# Patient Record
Sex: Female | Born: 1980 | Race: Black or African American | Hispanic: No | Marital: Married | Smoking: Never smoker
Health system: Southern US, Community
[De-identification: ages and names within clinical notes are randomized; demographics above are authoritative.]

## PROBLEM LIST (undated history)

## (undated) ENCOUNTER — Inpatient Hospital Stay: Payer: Self-pay

## (undated) DIAGNOSIS — E079 Disorder of thyroid, unspecified: Secondary | ICD-10-CM

## (undated) HISTORY — PX: THYROIDECTOMY, PARTIAL: SHX18

---

## 2015-10-03 ENCOUNTER — Emergency Department
Admission: EM | Admit: 2015-10-03 | Discharge: 2015-10-03 | Disposition: A | Discharge status: Home / Self Care | Payer: BLUE CROSS/BLUE SHIELD | Attending: Emergency Medicine | Admitting: Emergency Medicine

## 2015-10-03 ENCOUNTER — Emergency Department: Payer: BLUE CROSS/BLUE SHIELD

## 2015-10-03 DIAGNOSIS — R079 Chest pain, unspecified: Secondary | ICD-10-CM | POA: Diagnosis present

## 2015-10-03 LAB — COMPREHENSIVE METABOLIC PANEL
ALT: 15 U/L (ref 14–54)
ANION GAP: 5 (ref 5–15)
AST: 18 U/L (ref 15–41)
Albumin: 4.3 g/dL (ref 3.5–5.0)
Alkaline Phosphatase: 56 U/L (ref 38–126)
BUN: 12 mg/dL (ref 6–20)
CHLORIDE: 107 mmol/L (ref 101–111)
CO2: 28 mmol/L (ref 22–32)
Calcium: 9.4 mg/dL (ref 8.9–10.3)
Creatinine, Ser: 0.76 mg/dL (ref 0.44–1.00)
Glucose, Bld: 107 mg/dL — ABNORMAL HIGH (ref 65–99)
POTASSIUM: 4 mmol/L (ref 3.5–5.1)
Sodium: 140 mmol/L (ref 135–145)
Total Bilirubin: 0.6 mg/dL (ref 0.3–1.2)
Total Protein: 7.4 g/dL (ref 6.5–8.1)

## 2015-10-03 LAB — TROPONIN I

## 2015-10-03 LAB — CBC
HCT: 40.4 % (ref 35.0–47.0)
Hemoglobin: 13.2 g/dL (ref 12.0–16.0)
MCH: 29.3 pg (ref 26.0–34.0)
MCHC: 32.7 g/dL (ref 32.0–36.0)
MCV: 89.5 fL (ref 80.0–100.0)
PLATELETS: 237 10*3/uL (ref 150–440)
RBC: 4.52 MIL/uL (ref 3.80–5.20)
RDW: 13.1 % (ref 11.5–14.5)
WBC: 7.2 10*3/uL (ref 3.6–11.0)

## 2015-10-03 LAB — FIBRIN DERIVATIVES D-DIMER (ARMC ONLY): FIBRIN DERIVATIVES D-DIMER (ARMC): 467 (ref 0–499)

## 2015-10-03 MED ORDER — IBUPROFEN 600 MG PO TABS: 600.0000 mg | ORAL_TABLET | Freq: Four times a day (QID) | ORAL | Status: DC | PRN

## 2015-10-03 MED ORDER — TRAMADOL HCL 50 MG PO TABS: 50.0000 mg | ORAL_TABLET | Freq: Four times a day (QID) | ORAL | Status: AC | PRN

## 2015-10-03 NOTE — Discharge Instructions (Signed)

## 2015-10-03 NOTE — ED Notes (Signed)
Patient with right side chest pain that started yesterday and is worse with cough and deep breathing.

## 2015-10-03 NOTE — ED Provider Notes (Signed)
[facility] Emergency Department Provider Note  Time seen: 3:26 PM  I have reviewed the triage vital signs and the nursing notes.   HISTORY  Chief Complaint Chest Pain    HPI Rebecca Walters is a 34 y.o. female with no past medical history who presents the emergency department with mid and right-sided chest pain since yesterday. According to the patient she developed mid to right-sided chest pain yesterday. Does not recall doing anything to cause the pain. States the pain is much worse with any deep breath or cough. Denies any recent cough however. Denies any fever, shortness of breath, leg pain or swelling. Denies any history of DVT or cardiac disease. Denies any family history of DVT. Denies fever. Denies hemoptysis. Describes her central to right-sided chest pain as mild at baseline, moderate sharp pain with deep inspiration.    History reviewed. No pertinent past medical history.  There are no active problems to display for this patient.   History reviewed. No pertinent past surgical history.  No current outpatient prescriptions on file.  Allergies Review of patient's allergies indicates no known allergies.  History reviewed. No pertinent family history.  Social History Social History  Substance Use Topics  . Smoking status: Never Smoker   . Smokeless tobacco: None  . Alcohol Use: Yes    Review of Systems Constitutional: Negative for fever. Cardiovascular: Positive for chest pain worse with inspiration Respiratory: Negative for shortness of breath. Gastrointestinal: Negative for abdominal pain, vomiting and diarrhea. Negative for nausea. Musculoskeletal: Negative for back pain Neurological: Negative for headache 10-point ROS otherwise negative.  ____________________________________________   PHYSICAL EXAM:  VITAL SIGNS: ED Triage Vitals  Enc Vitals Group     BP 10/03/15 1358 130/78 mmHg     Pulse Rate 10/03/15 1358 63     Resp  10/03/15 1358 17     Temp 10/03/15 1358 98.6 F (37 C)     Temp Source 10/03/15 1358 Oral     SpO2 10/03/15 1358 99 %     Weight 10/03/15 1358 200 lb (90.719 kg)     Height 10/03/15 1358  (1.676 m)     Head Cir --      Peak Flow --      Pain Score 10/03/15 1325 10     Pain Loc --      Pain Edu? --      Excl. in GC? --    Constitutional: Alert and oriented. Well appearing and in no distress. Eyes: Normal exam ENT   Head: Normocephalic and atraumatic   Mouth/Throat: Mucous membranes are moist. Cardiovascular: Normal rate, regular rhythm. No murmur Respiratory: Normal respiratory effort without tachypnea nor retractions. Breath sounds are clear and equal bilaterally. No wheezes/rales/rhonchi. No chest pain to palpation. Gastrointestinal: Soft and nontender. No distention.   Musculoskeletal: Nontender with normal range of motion in all extremities. No lower extremity tenderness or edema. Neurologic:  Normal speech and language. No gross focal neurologic deficits Skin:  Skin is warm, dry and intact.  Psychiatric: Mood and affect are normal. Speech and behavior are normal.  ____________________________________________    EKG  EKG reviewed and interpreted by myself shows normal sinus rhythm at 62 bpm, narrow QS, normal axis, normal intervals, no ST changes present. Overall normal EKG.  ____________________________________________    RADIOLOGY  Chest x-ray shows no acute abnormality.  ____________________________________________    INITIAL IMPRESSION / ASSESSMENT AND PLAN / ED COURSE  Pertinent labs & imaging results that were available  during my care of the patient were reviewed by me and considered in my medical decision making (see chart for details).  Patient presents with pleuritic chest pain since yesterday. Her labs are reassuring, negative troponin. Negative chest x-ray, normal EKG. 63 pulse rate, 99% oxygen saturation. However given the pleuritic nature  of the patient's chest pain, will proceed with a d-dimer to help further evaluate. I discussed this with the patient who is agreeable.  D-dimer less than 500. We'll discharge home with primary care follow-up. We'll prescribe a short course of Ultram as well as anti-inflammatories. Patient agreeable to plan. Discussed strict chest pain return precautions with the patient which she is agreeable.  ____________________________________________   FINAL CLINICAL IMPRESSION(S) / ED DIAGNOSES  Chest pain   Minna AntisKevin Aisea Bouldin, MD 10/03/15 1722

## 2016-03-11 ENCOUNTER — Emergency Department
Admission: EM | Admit: 2016-03-11 | Discharge: 2016-03-11 | Disposition: A | Discharge status: Home / Self Care | Payer: BLUE CROSS/BLUE SHIELD | Attending: Student | Admitting: Student

## 2016-03-11 ENCOUNTER — Emergency Department: Payer: BLUE CROSS/BLUE SHIELD

## 2016-03-11 ENCOUNTER — Encounter: Payer: Self-pay | Admitting: Emergency Medicine

## 2016-03-11 DIAGNOSIS — R52 Pain, unspecified: Secondary | ICD-10-CM

## 2016-03-11 DIAGNOSIS — R1031 Right lower quadrant pain: Secondary | ICD-10-CM | POA: Diagnosis present

## 2016-03-11 DIAGNOSIS — N739 Female pelvic inflammatory disease, unspecified: Secondary | ICD-10-CM

## 2016-03-11 LAB — COMPREHENSIVE METABOLIC PANEL
ALBUMIN: 3.9 g/dL (ref 3.5–5.0)
ALK PHOS: 56 U/L (ref 38–126)
ALT: 16 U/L (ref 14–54)
AST: 19 U/L (ref 15–41)
Anion gap: 7 (ref 5–15)
BUN: 9 mg/dL (ref 6–20)
CALCIUM: 9.3 mg/dL (ref 8.9–10.3)
CHLORIDE: 103 mmol/L (ref 101–111)
CO2: 28 mmol/L (ref 22–32)
CREATININE: 0.72 mg/dL (ref 0.44–1.00)
GFR calc Af Amer: >60 mL/min (ref >60)
GFR calc non Af Amer: >60 mL/min (ref >60)
GLUCOSE: 121 mg/dL — AB (ref 65–99)
Potassium: 3.5 mmol/L (ref 3.5–5.1)
SODIUM: 138 mmol/L (ref 135–145)
Total Bilirubin: 0.8 mg/dL (ref 0.3–1.2)
Total Protein: 7.2 g/dL (ref 6.5–8.1)

## 2016-03-11 LAB — CBC WITH DIFFERENTIAL/PLATELET
BASOS ABS: 0 10*3/uL (ref 0–0.1)
BASOS PCT: 0 %
EOS ABS: 0.1 10*3/uL (ref 0–0.7)
EOS PCT: 1 %
HCT: 39.8 % (ref 35.0–47.0)
HEMOGLOBIN: 13.6 g/dL (ref 12.0–16.0)
LYMPHS ABS: 1.6 10*3/uL (ref 1.0–3.6)
Lymphocytes Relative: 22 %
MCH: 30.3 pg (ref 26.0–34.0)
MCHC: 34.2 g/dL (ref 32.0–36.0)
MCV: 88.8 fL (ref 80.0–100.0)
MONOS PCT: 4 %
Monocytes Absolute: 0.3 10*3/uL (ref 0.2–0.9)
Neutro Abs: 5.1 10*3/uL (ref 1.4–6.5)
Neutrophils Relative %: 73 %
PLATELETS: 251 10*3/uL (ref 150–440)
RBC: 4.49 MIL/uL (ref 3.80–5.20)
RDW: 12.6 % (ref 11.5–14.5)
WBC: 7 10*3/uL (ref 3.6–11.0)

## 2016-03-11 LAB — URINALYSIS COMPLETE WITH MICROSCOPIC (ARMC ONLY)
BACTERIA UA: NONE SEEN
Bilirubin Urine: NEGATIVE
Glucose, UA: NEGATIVE mg/dL
Hgb urine dipstick: NEGATIVE
KETONES UR: NEGATIVE mg/dL
NITRITE: NEGATIVE
PH: 6 (ref 5.0–8.0)
PROTEIN: NEGATIVE mg/dL
SPECIFIC GRAVITY, URINE: 1.023 (ref 1.005–1.030)

## 2016-03-11 LAB — WET PREP, GENITAL
Clue Cells Wet Prep HPF POC: NONE SEEN
SPERM: NONE SEEN
Trich, Wet Prep: NONE SEEN
YEAST WET PREP: NONE SEEN

## 2016-03-11 LAB — POCT PREGNANCY, URINE: PREG TEST UR: NEGATIVE

## 2016-03-11 LAB — LIPASE, BLOOD: Lipase: 10 U/L — ABNORMAL LOW (ref 11–51)

## 2016-03-11 LAB — CHLAMYDIA/NGC RT PCR (ARMC ONLY)
Chlamydia Tr: NOT DETECTED
N gonorrhoeae: NOT DETECTED

## 2016-03-11 MED ORDER — LIDOCAINE HCL (PF) 1 % IJ SOLN
5.0000 mL | Freq: Once | INTRAMUSCULAR | Status: AC
  Administered 2016-03-11: 5 mL
  Filled 2016-03-11: qty 5

## 2016-03-11 MED ORDER — CEFTRIAXONE SODIUM 250 MG IJ SOLR
250.0000 mg | Freq: Once | INTRAMUSCULAR | Status: AC
  Administered 2016-03-11: 250 mg via INTRAMUSCULAR
  Filled 2016-03-11: qty 250

## 2016-03-11 MED ORDER — AZITHROMYCIN 1 G PO PACK
1.0000 g | PACK | Freq: Once | ORAL | Status: AC
  Administered 2016-03-11: 1 g via ORAL
  Filled 2016-03-11: qty 1

## 2016-03-11 MED ORDER — IBUPROFEN 600 MG PO TABS: 600.0000 mg | ORAL_TABLET | Freq: Four times a day (QID) | ORAL | Status: DC | PRN

## 2016-03-11 MED ORDER — DOXYCYCLINE HYCLATE 50 MG PO CAPS: 100.0000 mg | ORAL_CAPSULE | Freq: Two times a day (BID) | ORAL | Status: DC

## 2016-03-11 MED ORDER — ONDANSETRON 4 MG PO TBDP
4.0000 mg | ORAL_TABLET | Freq: Once | ORAL | Status: AC
  Administered 2016-03-11: 4 mg via ORAL
  Filled 2016-03-11: qty 1

## 2016-03-11 MED ORDER — OXYCODONE HCL 5 MG PO TABS
10.0000 mg | ORAL_TABLET | Freq: Once | ORAL | Status: AC
  Administered 2016-03-11: 10 mg via ORAL
  Filled 2016-03-11: qty 2

## 2016-03-11 NOTE — Discharge Instructions (Signed)
You were seen in the emergency department for right-sided abdominal pain as well as pain above the bladder. Your  pain may be due to an infection but, as discussed, we could not rule out appendicitis wihtout a CT scan. Please take your antibiotics as described. Please return immediately to the emergency department if you develop severe or worsening pain, fevers, vomiting, diarrhea, blood in vomit or stool, inability to have a bowel movement, and her inability to urinate, chest pain, difficulty breathing, or for any other concerns.

## 2016-03-11 NOTE — ED Provider Notes (Signed)
[facility] Emergency Department Provider Note  ____________________________________________  Time seen: Approximately 1:17 PM  I have reviewed the triage vital signs and the nursing notes.   HISTORY  Chief Complaint Abdominal Pain    HPI Rebecca Walters is a 35 y.o. female with no chronic medical problems who presents for evaluation of less than 24 hours of right lower quadrant pain and suprapubic abdominal pain, gradual onset, constant since onset, moderate, no modifying factors. She reports that one week ago, she had some left-sided abdominal pain that resolved after she passed gas. Yesterday, she developed this right lower quadrant pain. She was seen in urgent care earlier this morning and was sent to the emergency department for further evaluation. She denies any nausea, vomiting, diarrhea, fevers or chills. She denies any abnormal vaginal bleeding or vaginal discharge. She report she does have an IUD in place. She reports that she has no concerns for sexually transmitted infection.   History reviewed. No pertinent past medical history.  There are no active problems to display for this patient.   History reviewed. No pertinent past surgical history.  Current Outpatient Rx  Name  Route  Sig  Dispense  Refill  . ibuprofen (ADVIL,MOTRIN) 600 MG tablet   Oral   Take 1 tablet (600 mg total) by mouth every 6 (six) hours as needed.   30 tablet   0   . traMADol (ULTRAM) 50 MG tablet   Oral   Take 1 tablet (50 mg total) by mouth every 6 (six) hours as needed.   10 tablet   0     Allergies Review of patient's allergies indicates no known allergies.  No family history on file.  Social History Social History  Substance Use Topics  . Smoking status: Never Smoker   . Smokeless tobacco: None  . Alcohol Use: Yes    Review of Systems Constitutional: No fever/chills Eyes: No visual changes. ENT: No sore throat. Cardiovascular: Denies chest  pain. Respiratory: Denies shortness of breath. Gastrointestinal: + abdominal pain.  No nausea, no vomiting.  No diarrhea.  No constipation. Genitourinary: Negative for dysuria. Musculoskeletal: Negative for back pain. Skin: Negative for rash. Neurological: Negative for headaches, focal weakness or numbness.  10-point ROS otherwise negative.  ____________________________________________   PHYSICAL EXAM:  VITAL SIGNS: ED Triage Vitals  Enc Vitals Group     BP 03/11/16 1024 126/86 mmHg     Pulse Rate 03/11/16 1024 68     Resp 03/11/16 1024 16     Temp 03/11/16 1024 98.3 F (36.8 C)     Temp Source 03/11/16 1024 Oral     SpO2 03/11/16 1024 100 %     Weight 03/11/16 1024 202 lb (91.627 kg)     Height 03/11/16 1024  (1.676 m)     Head Cir --      Peak Flow --      Pain Score 03/11/16 1021 7     Pain Loc --      Pain Edu? --      Excl. in GC? --     Constitutional: Alert and oriented. Well appearing and in no acute distress. Eyes: Conjunctivae are normal. PERRL. EOMI. Head: Atraumatic. Nose: No congestion/rhinnorhea. Mouth/Throat: Mucous membranes are moist.  Oropharynx non-erythematous. Neck: No stridor.  Supple without meningismus. Cardiovascular: Normal rate, regular rhythm. Grossly normal heart sounds.  Good peripheral circulation. Respiratory: Normal respiratory effort.  No retractions. Lungs CTAB. Gastrointestinal: Soft with moderate tenderness in the right lower  quadrant and the super pubic region. No CVA tenderness. Pelvic exam: Cervix appears inflamed multiple erosions, there is a thick whitish yellow discharge from a closed os, the strings of the IUD are visualized exiting the os. There is bimanual and cervical motion tenderness. Musculoskeletal: No lower extremity tenderness nor edema.  No joint effusions. Neurologic:  Normal speech and language. No gross focal neurologic deficits are appreciated. No gait instability. Skin:  Skin is warm, dry and intact. No  rash noted. Psychiatric: Mood and affect are normal. Speech and behavior are normal.  ____________________________________________   LABS (all labs ordered are listed, but only abnormal results are displayed)  Labs Reviewed  WET PREP, GENITAL - Abnormal; Notable for the following:    WBC, Wet Prep HPF POC MODERATE (*)    All other components within normal limits  COMPREHENSIVE METABOLIC PANEL - Abnormal; Notable for the following:    Glucose, Bld 121 (*)    All other components within normal limits  LIPASE, BLOOD - Abnormal; Notable for the following:    Lipase 10 (*)    All other components within normal limits  URINALYSIS COMPLETEWITH MICROSCOPIC ([facility] ONLY) - Abnormal; Notable for the following:    Color, Urine YELLOW (*)    APPearance CLEAR (*)    Leukocytes, UA 2+ (*)    Squamous Epithelial / LPF 0-5 (*)    All other components within normal limits  CHLAMYDIA/NGC RT PCR ([facility] ONLY)  CBC WITH DIFFERENTIAL/PLATELET  POC URINE PREG, ED  POCT PREGNANCY, URINE   ____________________________________________  EKG  none ____________________________________________  RADIOLOGY  Ultrasound pelvis  IMPRESSION: IUD in expected position.  Unremarkable pelvic ultrasound. No evidence of ovarian torsion. ____________________________________________   PROCEDURES  Procedure(s) performed: None  Critical Care performed: No  ____________________________________________   INITIAL IMPRESSION / ASSESSMENT AND PLAN / ED COURSE  Pertinent labs & imaging results that were available during my care of the patient were reviewed by me and considered in my medical decision making (see chart for details).  Rebecca Walters is a 35 y.o. female with no chronic medical problems who presents for evaluation of less than 24 hours of right lower quadrant pain and suprapubic abdominal pain. On exam, she is well-appearing and in no acute distress. Vital signs stable, she is  afebrile. She does have tenderness to palpation in the right lower quadrant and the suprapubic region. There is no rebound or guarding. Normal CBC, CMP and lipase. Negative urine pregnancy test. Urinalysis with 2+ leukocytes however no other findings to suggest urinary tract infection, minimal white blood cells, no bacteria, negative nitrites. We'll obtain pelvic ultrasound with Doppler, treat her pain. Reassess for need for advanced imaging and reassess for disposition.  ----------------------------------------- 3:30 PM on 03/11/2016 -----------------------------------------  She reports she feels much better after oxycodone. Ultrasound unremarkable, no torsion. Other exam showed thick colored discharge from an eroded cervix and she does have bimanual and cervical motion tenderness, we'll treat for pelvic inflammatory disease. I discussed with the patient that appendicitis cannot be ruled out at this time without CT scan of the abdomen and pelvis. We discussed the risks and benefits of CT scan. I have offered her a CT scan at this time to definitively rule out appendicitis however she has declined scan, opting instead to watch and wait. We discussed return precautions, need for close follow-up and she is comfortable with the discharge plan. Her wet prep is negative. GC chlamydia are pending. She does report that she has a history of  chlamydia. ____________________________________________   FINAL CLINICAL IMPRESSION(S) / ED DIAGNOSES  Final diagnoses:  Pain  RLQ abdominal pain      Gayla Doss, MD 03/11/16 [id number]

## 2016-03-11 NOTE — ED Notes (Signed)
Reports abd pain x 1 wk.  [facility] and sent here for further eval. NAD

## 2017-07-28 ENCOUNTER — Other Ambulatory Visit: Payer: Self-pay | Admitting: Family Medicine

## 2017-07-28 DIAGNOSIS — Z3201 Encounter for pregnancy test, result positive: Secondary | ICD-10-CM

## 2017-07-30 ENCOUNTER — Ambulatory Visit
Admission: RE | Admit: 2017-07-30 | Discharge: 2017-07-30 | Disposition: A | Discharge status: Home / Self Care | Payer: BLUE CROSS/BLUE SHIELD | Source: Ambulatory Visit | Attending: Family Medicine | Admitting: Family Medicine

## 2017-07-30 DIAGNOSIS — Z3201 Encounter for pregnancy test, result positive: Secondary | ICD-10-CM | POA: Diagnosis not present

## 2017-07-30 DIAGNOSIS — O3680X0 Pregnancy with inconclusive fetal viability, not applicable or unspecified: Secondary | ICD-10-CM | POA: Diagnosis present

## 2017-07-30 DIAGNOSIS — Z3A01 Less than 8 weeks gestation of pregnancy: Secondary | ICD-10-CM | POA: Diagnosis not present

## 2017-08-13 ENCOUNTER — Emergency Department
Admission: EM | Admit: 2017-08-13 | Discharge: 2017-08-13 | Disposition: A | Discharge status: Home / Self Care | Payer: BLUE CROSS/BLUE SHIELD | Attending: Emergency Medicine | Admitting: Emergency Medicine

## 2017-08-13 ENCOUNTER — Encounter: Payer: Self-pay | Admitting: *Deleted

## 2017-08-13 DIAGNOSIS — G43909 Migraine, unspecified, not intractable, without status migrainosus: Secondary | ICD-10-CM | POA: Diagnosis not present

## 2017-08-13 DIAGNOSIS — O9989 Other specified diseases and conditions complicating pregnancy, childbirth and the puerperium: Secondary | ICD-10-CM | POA: Diagnosis not present

## 2017-08-13 DIAGNOSIS — Z3A09 9 weeks gestation of pregnancy: Secondary | ICD-10-CM | POA: Insufficient documentation

## 2017-08-13 MED ORDER — KETOROLAC TROMETHAMINE 30 MG/ML IJ SOLN
INTRAMUSCULAR | Status: AC
  Administered 2017-08-13: 15 mg via INTRAVENOUS
  Filled 2017-08-13: qty 1

## 2017-08-13 MED ORDER — MAGNESIUM SULFATE 2 GM/50ML IV SOLN
2.0000 g | Freq: Once | INTRAVENOUS | Status: AC
  Administered 2017-08-13: 2 g via INTRAVENOUS
  Filled 2017-08-13: qty 50

## 2017-08-13 MED ORDER — KETOROLAC TROMETHAMINE 30 MG/ML IJ SOLN
15.0000 mg | Freq: Once | INTRAMUSCULAR | Status: AC
  Administered 2017-08-13: 15 mg via INTRAVENOUS

## 2017-08-13 MED ORDER — DEXAMETHASONE SODIUM PHOSPHATE 10 MG/ML IJ SOLN
10.0000 mg | Freq: Once | INTRAMUSCULAR | Status: AC
  Administered 2017-08-13: 10 mg via INTRAVENOUS
  Filled 2017-08-13: qty 1

## 2017-08-13 MED ORDER — METOCLOPRAMIDE HCL 5 MG/ML IJ SOLN
INTRAMUSCULAR | Status: AC
  Administered 2017-08-13: 10 mg via INTRAVENOUS
  Filled 2017-08-13: qty 2

## 2017-08-13 MED ORDER — DIPHENHYDRAMINE HCL 50 MG/ML IJ SOLN
INTRAMUSCULAR | Status: AC
  Administered 2017-08-13: 12.5 mg via INTRAVENOUS
  Filled 2017-08-13: qty 1

## 2017-08-13 MED ORDER — SODIUM CHLORIDE 0.9 % IV BOLUS (SEPSIS)
500.0000 mL | INTRAVENOUS | Status: AC
  Administered 2017-08-13: 500 mL via INTRAVENOUS

## 2017-08-13 MED ORDER — DIPHENHYDRAMINE HCL 50 MG/ML IJ SOLN
12.5000 mg | INTRAMUSCULAR | Status: AC
  Administered 2017-08-13: 12.5 mg via INTRAVENOUS

## 2017-08-13 MED ORDER — DEXAMETHASONE SODIUM PHOSPHATE 10 MG/ML IJ SOLN
INTRAMUSCULAR | Status: AC
  Administered 2017-08-13: 10 mg via INTRAVENOUS
  Filled 2017-08-13: qty 1

## 2017-08-13 MED ORDER — METOCLOPRAMIDE HCL 5 MG/ML IJ SOLN
10.0000 mg | INTRAMUSCULAR | Status: AC
  Administered 2017-08-13: 10 mg via INTRAVENOUS

## 2017-08-13 NOTE — ED Notes (Signed)
Pt c/o headache x3days with senst to light. Pt denies hx of migraines. Pt ambulatory, A&Ox4, speech clear

## 2017-08-13 NOTE — ED Provider Notes (Signed)
[facility] Emergency Department Provider Note  ____________________________________________   First MD Initiated Contact with Patient 08/13/17 1904     (approximate)  I have reviewed the triage vital signs and the nursing notes.   HISTORY  Chief Complaint Headache    HPI Rebecca Walters is a 36 y.o. female G5 P4 at approximately [redacted] weeks gestation who presents for evaluation of about 3 days of headache.  She reports that is severe, mostly in the front of her head on both sides, and nothing makes it better. bright lights and loud noises seem to make it worse.  She has had nausea but no vomiting.  She denies vaginal bleeding, abdominal pain, abdominal cramps, dysuria, numbness or weakness in her extremities, and visual changes.  She is able to ambulate without difficulty.  She does not have a history of regular headaches nor migraines.  She has not had any traumatic injuries recently.   No past medical history on file.  There are no active problems to display for this patient.   No past surgical history on file.  Prior to Admission medications   Medication Sig Start Date End Date Taking? Authorizing Provider  doxycycline (VIBRAMYCIN) 50 MG capsule Take 2 capsules (100 mg total) by mouth 2 (two) times daily. 03/11/16   Gayla Doss, MD  ibuprofen (ADVIL,MOTRIN) 600 MG tablet Take 1 tablet (600 mg total) by mouth every 6 (six) hours as needed. 10/03/15   Minna Antis, MD  ibuprofen (ADVIL,MOTRIN) 600 MG tablet Take 1 tablet (600 mg total) by mouth every 6 (six) hours as needed for moderate pain. 03/11/16   Gayla Doss, MD    Allergies Patient has no known allergies.  No family history on file.  Social History Social History  Substance Use Topics  . Smoking status: Never Smoker  . Smokeless tobacco: Never Used  . Alcohol use Yes    Review of Systems Constitutional: No fever/chills Eyes: No visual changes. +Photophobia ENT: No sore  throat. Cardiovascular: Denies chest pain. Respiratory: Denies shortness of breath. Gastrointestinal: No abdominal pain.  Nausea, no vomiting.  No diarrhea.  No constipation. Genitourinary: Negative for dysuria. no vaginal bleeding Musculoskeletal: Negative for neck pain.  Negative for back pain. Integumentary: Negative for rash. Neurological: frontal bilateral throbbing headache for about 3 days.  No focal numbness or weakness.  No difficulty with ambulation   ____________________________________________   PHYSICAL EXAM:  VITAL SIGNS: ED Triage Vitals  Enc Vitals Group     BP 08/13/17 1739 120/66     Pulse Rate 08/13/17 1739 81     Resp 08/13/17 1739 18     Temp 08/13/17 1739 99.1 F (37.3 C)     Temp Source 08/13/17 1739 Oral     SpO2 08/13/17 1739 99 %     Weight 08/13/17 1734 97.1 kg (214 lb)     Height 08/13/17 1734 1.676 m ( )     Head Circumference --      Peak Flow --      Pain Score 08/13/17 1734 7     Pain Loc --      Pain Edu? --      Excl. in GC? --     Constitutional: Alert and oriented. Well appearing and in no acute distress all she does appear somewhat uncomfortable Eyes: Conjunctivae are normal. PERRL. EOMI. no papilledema funduscopic exam Head: Atraumatic. Nose: No congestion/rhinnorhea. Mouth/Throat: Mucous membranes are moist. Neck: No stridor.  No meningeal signs.  Cardiovascular: Normal rate, regular rhythm. Good peripheral circulation. Grossly normal heart sounds. Respiratory: Normal respiratory effort.  No retractions. Lungs CTAB. Gastrointestinal: Soft and nontender. No distention.  Musculoskeletal: No lower extremity tenderness nor edema. No gross deformities of extremities. Neurologic:  Normal speech and language. No gross focal neurologic deficits are appreciated.  Skin:  Skin is warm, dry and intact. No rash noted. Psychiatric: Mood and affect are normal. Speech and behavior are normal.  ____________________________________________    LABS (all labs ordered are listed, but only abnormal results are displayed)  Labs Reviewed - No data to display ____________________________________________  EKG  None - EKG not ordered by ED physician ____________________________________________  RADIOLOGY   No results found.  ____________________________________________   PROCEDURES  Critical Care performed: No   Procedure(s) performed:   Procedures   ____________________________________________   INITIAL IMPRESSION / ASSESSMENT AND PLAN / ED COURSE  Pertinent labs & imaging results that were available during my care of the patient were reviewed by me and considered in my medical decision making (see chart for details).  Differential diagnosis includes but is not exclusive to subarachnoid hemorrhage, meningitis, encephalitis, previous head trauma, cavernous venous thrombosis, muscle tension headache, temporal arteritis, migraine or migraine equivalent, etc. However, the patient is well-appearing with normal vital signs and I think her symptoms are most consistent with migraine.  She has no focal neurological deficits and I suspect that the hormonal changes she is going through early in her pregnancy or leading to a migraine, especially people can have migraines when being on birth control pills.  I will plan to give her my usual migraine cocktail and then reassessed to determine if there is any indication for imaging at this time.  I will verify with the on-call OB/GYN that there is no contraindication to Reglan, Benadryl, Decadron, magnesium, and a low dose of Toradol early in pregnancy.  The patient agrees with this plan.    Clinical Course as of Aug 13 2316  Thu Aug 13, 2017  1610 verified with Dr. Dalbert Garnet that none of the medications in my usual migraine cocktail (including Toradol and decadron) would be detrimental at this stage in pregnancy.  Proceeding with plan.  [CF]  2129 Patient states she feels much better  after migraine treatment and is ready to go home.  No evidence fo acute/emergent medical condition that would keep her in the hospital.    I gave my usual and customary return precautions.  Patient agrees with the plan.  [CF]    Clinical Course User Index [CF] Loleta Rose, MD    ____________________________________________  FINAL CLINICAL IMPRESSION(S) / ED DIAGNOSES  Final diagnoses:  Migraine without status migrainosus, not intractable, unspecified migraine type     MEDICATIONS GIVEN DURING THIS VISIT:  Medications  metoCLOPramide (REGLAN) injection 10 mg (10 mg Intravenous Given 08/13/17 1941)  diphenhydrAMINE (BENADRYL) injection 12.5 mg (12.5 mg Intravenous Given 08/13/17 1941)  dexamethasone (DECADRON) injection 10 mg (10 mg Intravenous Given 08/13/17 1941)  magnesium sulfate IVPB 2 g 50 mL (0 g Intravenous Stopped 08/13/17 2122)  sodium chloride 0.9 % bolus 500 mL (0 mLs Intravenous Stopped 08/13/17 2115)  ketorolac (TORADOL) 30 MG/ML injection 15 mg (15 mg Intravenous Given 08/13/17 1941)     NEW OUTPATIENT MEDICATIONS STARTED DURING THIS VISIT:  Discharge Medication List as of 08/13/2017  9:39 PM      Discharge Medication List as of 08/13/2017  9:39 PM      Discharge Medication List as of 08/13/2017  9:39  PM       Note:  This document was prepared using Dragon voice recognition software and may include unintentional dictation errors.    Loleta Rose, MD 08/13/17 [phone]

## 2017-08-13 NOTE — ED Triage Notes (Signed)
Pt has a headache for 3 days. Pt taking otc meds without relief.  Pt is Pregnant approx 9 weeks.  No vag bleeding or abd pain.  No urinary sx.  Pt alert  Speech clear.  No diff ambulating.

## 2017-08-13 NOTE — Discharge Instructions (Signed)
You have been seen in the Emergency Department (ED) for a migraine.  Please use Tylenol as needed for symptoms, but only as written on the box, and take any regular medications that have been prescribed for you. As we have discussed, please follow up with your doctor as soon as possible regarding today?s ED visit and your headache symptoms.  If you are not already taking prenatal vitamins, please start doing so, and follow up with your prenatal provider at the next available opportunity.  Call your doctor or return to the Emergency Department (ED) if you have a worsening headache, sudden and severe headache, confusion, slurred speech, facial droop, weakness or numbness in any arm or leg, extreme fatigue, or other symptoms that concern you.

## 2017-09-18 ENCOUNTER — Encounter: Payer: Self-pay | Admitting: Emergency Medicine

## 2017-09-18 ENCOUNTER — Emergency Department
Admission: EM | Admit: 2017-09-18 | Discharge: 2017-09-18 | Disposition: A | Discharge status: Home / Self Care | Payer: BLUE CROSS/BLUE SHIELD | Attending: Emergency Medicine | Admitting: Emergency Medicine

## 2017-09-18 DIAGNOSIS — O26899 Other specified pregnancy related conditions, unspecified trimester: Secondary | ICD-10-CM

## 2017-09-18 DIAGNOSIS — R1030 Lower abdominal pain, unspecified: Secondary | ICD-10-CM | POA: Insufficient documentation

## 2017-09-18 DIAGNOSIS — R109 Unspecified abdominal pain: Secondary | ICD-10-CM

## 2017-09-18 DIAGNOSIS — O9989 Other specified diseases and conditions complicating pregnancy, childbirth and the puerperium: Secondary | ICD-10-CM | POA: Diagnosis not present

## 2017-09-18 DIAGNOSIS — Z3A13 13 weeks gestation of pregnancy: Secondary | ICD-10-CM | POA: Diagnosis not present

## 2017-09-18 LAB — URINALYSIS, COMPLETE (UACMP) WITH MICROSCOPIC
Bacteria, UA: NONE SEEN
Bilirubin Urine: NEGATIVE
GLUCOSE, UA: NEGATIVE mg/dL
Hgb urine dipstick: NEGATIVE
Ketones, ur: NEGATIVE mg/dL
Nitrite: NEGATIVE
PH: 6 (ref 5.0–8.0)
Protein, ur: NEGATIVE mg/dL
SPECIFIC GRAVITY, URINE: 1.029 (ref 1.005–1.030)

## 2017-09-18 LAB — COMPREHENSIVE METABOLIC PANEL
ALBUMIN: 3.8 g/dL (ref 3.5–5.0)
ALK PHOS: 46 U/L (ref 38–126)
ALT: 13 U/L — ABNORMAL LOW (ref 14–54)
AST: 20 U/L (ref 15–41)
Anion gap: 8 (ref 5–15)
BUN: 11 mg/dL (ref 6–20)
CALCIUM: 9.7 mg/dL (ref 8.9–10.3)
CO2: 25 mmol/L (ref 22–32)
CREATININE: 0.8 mg/dL (ref 0.44–1.00)
Chloride: 103 mmol/L (ref 101–111)
GFR calc Af Amer: >60 mL/min (ref >60)
Glucose, Bld: 96 mg/dL (ref 65–99)
Potassium: 3.7 mmol/L (ref 3.5–5.1)
Sodium: 136 mmol/L (ref 135–145)
Total Bilirubin: 0.5 mg/dL (ref 0.3–1.2)
Total Protein: 7.4 g/dL (ref 6.5–8.1)

## 2017-09-18 LAB — CBC
HCT: 38.9 % (ref 35.0–47.0)
Hemoglobin: 13.1 g/dL (ref 12.0–16.0)
MCH: 30 pg (ref 26.0–34.0)
MCHC: 33.7 g/dL (ref 32.0–36.0)
MCV: 89.1 fL (ref 80.0–100.0)
PLATELETS: 245 10*3/uL (ref 150–440)
RBC: 4.37 MIL/uL (ref 3.80–5.20)
RDW: 13 % (ref 11.5–14.5)
WBC: 8.5 10*3/uL (ref 3.6–11.0)

## 2017-09-18 LAB — LIPASE, BLOOD: Lipase: 15 U/L (ref 11–51)

## 2017-09-18 NOTE — Discharge Instructions (Signed)
Please seek medical attention for any high fevers, chest pain, shortness of breath, change in behavior, persistent vomiting, bloody stool or any other new or concerning symptoms.  

## 2017-09-18 NOTE — ED Triage Notes (Signed)
Pt to ED c/o RLQ abd pain that started about 40 minutes PTA. Pt states that she is [redacted] weeks pregnant and wants to be evaluated. Pt is G8P4A3. Pt denies vaginal bleeding. Pt in NAD at this time.

## 2017-09-18 NOTE — ED Triage Notes (Addendum)
First nurse note-ambulatory without distress. Color WNL.

## 2017-09-18 NOTE — ED Provider Notes (Signed)
[facility] Emergency Department Provider Note   ____________________________________________   I have reviewed the triage vital signs and the nursing notes.   HISTORY  Chief Complaint Abdominal Pain   History limited by: Not Limited   HPI Rebecca Walters is a 36 y.o. female who presents to the emergency department today because of abdominal pain.   LOCATION:initially across her whole lower abdomen, then more on right side DURATION:started today TIMING: started suddenly, has gradually been easing off, not present at the time of my exam SEVERITY: initially severe QUALITY: sharp CONTEXT: patient is roughly [redacted] weeks pregnant. Denies any unusual ingestion. No trauma.  MODIFYING FACTORS: none ASSOCIATED SYMPTOMS: no vaginal bleeding. No nausea or vomiting. No change in urination or defecation.  Per medical record review patient has a history of US performed last month which showed a single live IUP.  There are no active problems to display for this patient.   History reviewed. No pertinent surgical history.  Prior to Admission medications   Medication Sig Start Date End Date Taking? Authorizing Provider  doxycycline (VIBRAMYCIN) 50 MG capsule Take 2 capsules (100 mg total) by mouth 2 (two) times daily. 03/11/16   Gayla Doss, MD  ibuprofen (ADVIL,MOTRIN) 600 MG tablet Take 1 tablet (600 mg total) by mouth every 6 (six) hours as needed. 10/03/15   Minna Antis, MD  ibuprofen (ADVIL,MOTRIN) 600 MG tablet Take 1 tablet (600 mg total) by mouth every 6 (six) hours as needed for moderate pain. 03/11/16   Gayla Doss, MD    Allergies Patient has no known allergies.  No family history on file.  Social History Social History  Substance Use Topics  . Smoking status: Never Smoker  . Smokeless tobacco: Never Used  . Alcohol use Yes    Review of Systems Constitutional: No fever/chills Eyes: No visual changes. ENT: No sore  throat. Cardiovascular: Denies chest pain. Respiratory: Denies shortness of breath. Gastrointestinal: Positive for abdominal pain. Genitourinary: Negative for dysuria. Musculoskeletal: Negative for back pain. Skin: Negative for rash. Neurological: Negative for headaches, focal weakness or numbness.  ____________________________________________   PHYSICAL EXAM:  VITAL SIGNS: ED Triage Vitals  Enc Vitals Group     BP 09/18/17 1813 112/71     Pulse Rate 09/18/17 1812 73     Resp 09/18/17 1812 16     Temp 09/18/17 1812 98.8 F (37.1 C)     Temp Source 09/18/17 1812 Oral     SpO2 09/18/17 1812 99 %     Weight 09/18/17 1811 214 lb (97.1 kg)     Height --      Head Circumference --      Peak Flow --      Pain Score 09/18/17 1810 6   Constitutional: Alert and oriented. Well appearing and in no distress. Eyes: Conjunctivae are normal.  ENT   Head: Normocephalic and atraumatic.   Nose: No congestion/rhinnorhea.   Mouth/Throat: Mucous membranes are moist.   Neck: No stridor. Hematological/Lymphatic/Immunilogical: No cervical lymphadenopathy. Cardiovascular: Normal rate, regular rhythm.  No murmurs, rubs, or gallops.  Respiratory: Normal respiratory effort without tachypnea nor retractions. Breath sounds are clear and equal bilaterally. No wheezes/rales/rhonchi. Gastrointestinal: Soft and non tender. No rebound. No guarding.  Genitourinary: Deferred Musculoskeletal: Normal range of motion in all extremities. No lower extremity edema. Neurologic:  Normal speech and language. No gross focal neurologic deficits are appreciated.  Skin:  Skin is warm, dry and intact. No rash noted. Psychiatric: Mood and affect are normal.  Speech and behavior are normal. Patient exhibits appropriate insight and judgment.  ____________________________________________    LABS (pertinent positives/negatives)  CMP wnl except for alt 13 Lipase wnl UA not consistent with infection CBC  wnl  ____________________________________________   EKG  None  ____________________________________________    RADIOLOGY  None  ____________________________________________   PROCEDURES  Procedures  ____________________________________________   INITIAL IMPRESSION / ASSESSMENT AND PLAN / ED COURSE  Pertinent labs & imaging results that were available during my care of the patient were reviewed by me and considered in my medical decision making (see chart for details).  Differential diagnosis includes, but is not limited to, ovarian cyst, ovarian torsion, acute appendicitis, diverticulitis, urinary tract infection/pyelonephritis, endometriosis, bowel obstruction, colitis, renal colic, gastroenteritis, hernia, pregnancy related pain including ectopic pregnancy, etc.  Patient's pain had resolved by the time of examination. At this point I doubt serious or concerning cause of abdominal pain. Think appendicitis is less likely given lack of leukocytosis and fever. I think ectopic unlikely given confirmed IUP by ultrasound. I did discuss blood work findings and return precautions with the patient. Additionally I offered to ultrasound her abdomen and evaluate the fetus however the patient declined.  ______________________________________   FINAL CLINICAL IMPRESSION(S) / ED DIAGNOSES  Final diagnoses:  Abdominal pain during pregnancy, antepartum     Note: This dictation was prepared with Dragon dictation. Any transcriptional errors that result from this process are unintentional     Phineas SemenGoodman, Chenika Nevils, MD 09/18/17 1944

## 2017-10-27 ENCOUNTER — Emergency Department: Payer: BLUE CROSS/BLUE SHIELD

## 2017-10-27 ENCOUNTER — Other Ambulatory Visit: Payer: Self-pay

## 2017-10-27 ENCOUNTER — Emergency Department
Admission: EM | Admit: 2017-10-27 | Discharge: 2017-10-27 | Disposition: A | Discharge status: Home / Self Care | Payer: BLUE CROSS/BLUE SHIELD | Attending: Student in an Organized Health Care Education/Training Program | Admitting: Student in an Organized Health Care Education/Training Program

## 2017-10-27 DIAGNOSIS — Z3A19 19 weeks gestation of pregnancy: Secondary | ICD-10-CM | POA: Diagnosis not present

## 2017-10-27 DIAGNOSIS — R102 Pelvic and perineal pain: Secondary | ICD-10-CM | POA: Insufficient documentation

## 2017-10-27 DIAGNOSIS — O9989 Other specified diseases and conditions complicating pregnancy, childbirth and the puerperium: Secondary | ICD-10-CM | POA: Diagnosis present

## 2017-10-27 DIAGNOSIS — Z79899 Other long term (current) drug therapy: Secondary | ICD-10-CM | POA: Diagnosis not present

## 2017-10-27 DIAGNOSIS — O26899 Other specified pregnancy related conditions, unspecified trimester: Secondary | ICD-10-CM

## 2017-10-27 HISTORY — DX: Disorder of thyroid, unspecified: E07.9

## 2017-10-27 LAB — URINALYSIS, COMPLETE (UACMP) WITH MICROSCOPIC
BACTERIA UA: NONE SEEN
Bilirubin Urine: NEGATIVE
Glucose, UA: NEGATIVE mg/dL
HGB URINE DIPSTICK: NEGATIVE
Ketones, ur: 20 mg/dL — AB
Leukocytes, UA: NEGATIVE
NITRITE: NEGATIVE
PROTEIN: NEGATIVE mg/dL
Specific Gravity, Urine: 1.025 (ref 1.005–1.030)
pH: 5 (ref 5.0–8.0)

## 2017-10-27 LAB — HCG, QUANTITATIVE, PREGNANCY: hCG, Beta Chain, Quant, S: 25052 m[IU]/mL — ABNORMAL HIGH (ref <5)

## 2017-10-27 NOTE — ED Notes (Signed)
Pt states she started having sharp lower abd pain while shopping today. States "something that was clear came out and I don't know what it was." pt states it came out of her vagina. States pain comes and goes. Denies N&V&D. Has not taken any medication. No radiation of pain. Still has belly organs. Alert, oriented, ambulatory.

## 2017-10-27 NOTE — ED Triage Notes (Signed)
Pt states she was going out shopping and had sudden onset sharp lower abd pain with a little bit of clear liquid leaking. Pt states she is [redacted] weeks pregnant. Denies any bleeding. This is her 7th pregnancy with 4 living children. [facility] and has an apt tomorrow.

## 2017-10-27 NOTE — ED Provider Notes (Signed)
[facility] Emergency Department Provider Note    First MD Initiated Contact with Patient 10/27/17 1534     (approximate)  I have reviewed the triage vital signs and the nursing notes.   HISTORY  Chief Complaint Abdominal Pain ([redacted] weeks pregnant)    HPI Rebecca Walters is a 36 y.o. female who is roughly [redacted] weeks pregnant presents with sudden onset bilateral suprapubic and pelvic pain that occurred on the patient was with her daughter and shopping today.  Says the pain was mild to moderate in severity and sharp in nature.  No radiation.  Denies any dysuria.  No vaginal bleeding.  Did note clear discharge while she was driving back.  Denies any dysuria.  No pain right now.  Still feels that the baby is moving and kicking since arriving in the ER.   Past Medical History:  Diagnosis Date  . Thyroid disease    No family history on file. Past Surgical History:  Procedure Laterality Date  . THYROIDECTOMY, PARTIAL     There are no active problems to display for this patient.     Prior to Admission medications   Medication Sig Start Date End Date Taking? Authorizing Provider  doxycycline (VIBRAMYCIN) 50 MG capsule Take 2 capsules (100 mg total) by mouth 2 (two) times daily. 03/11/16   Gayla DossGayle, Eryka A, MD  ibuprofen (ADVIL,MOTRIN) 600 MG tablet Take 1 tablet (600 mg total) by mouth every 6 (six) hours as needed. 10/03/15   Minna AntisPaduchowski, Kevin, MD  ibuprofen (ADVIL,MOTRIN) 600 MG tablet Take 1 tablet (600 mg total) by mouth every 6 (six) hours as needed for moderate pain. 03/11/16   Gayla DossGayle, Eryka A, MD    Allergies Patient has no known allergies.    Social History Social History   Tobacco Use  . Smoking status: Never Smoker  . Smokeless tobacco: Never Used  Substance Use Topics  . Alcohol use: Yes  . Drug use: No    Review of Systems Patient denies headaches, rhinorrhea, blurry vision, numbness, shortness of breath, chest pain, edema, cough, abdominal  pain, nausea, vomiting, diarrhea, dysuria, fevers, rashes or hallucinations unless otherwise stated above in HPI. ____________________________________________   PHYSICAL EXAM:  VITAL SIGNS: Vitals:   10/27/17 1400  BP: 127/70  Pulse: 81  Resp: 17  Temp: 97.6 F (36.4 C)  SpO2: 100%    Constitutional: Alert and oriented. Well appearing and in no acute distress. Eyes: Conjunctivae are normal.  Head: Atraumatic. Nose: No congestion/rhinnorhea. Mouth/Throat: Mucous membranes are moist.   Neck: No stridor. Painless ROM.  Cardiovascular: Normal rate, regular rhythm. Grossly normal heart sounds.  Good peripheral circulation. Respiratory: Normal respiratory effort.  No retractions. Lungs CTAB. Gastrointestinal: Soft and nontender. No distention. No abdominal bruits. No CVA tenderness. Genitourinary: Normal-appearing closed cervix.  Nitrazine paper test is not consistent with amniotic fluid pH less than 6.  No bleeding. Musculoskeletal: No lower extremity tenderness nor edema.  No joint effusions. Neurologic:  Normal speech and language. No gross focal neurologic deficits are appreciated. No facial droop Skin:  Skin is warm, dry and intact. No rash noted. Psychiatric: Mood and affect are normal. Speech and behavior are normal.  ____________________________________________   LABS (all labs ordered are listed, but only abnormal results are displayed)  Results for orders placed or performed during the hospital encounter of 10/27/17 (from the past 24 hour(s))  hCG, quantitative, pregnancy     Status: Abnormal   Collection Time: 10/27/17  2:04 PM  Result Value  Ref Range   hCG, Beta Chain, Quant, S 25,052 (H) <5 mIU/mL  Urinalysis, Complete w Microscopic     Status: Abnormal   Collection Time: 10/27/17  2:04 PM  Result Value Ref Range   Color, Urine YELLOW (A) YELLOW   APPearance CLEAR (A) CLEAR   Specific Gravity, Urine 1.025 1.005 - 1.030   pH 5.0 5.0 - 8.0   Glucose, UA NEGATIVE  NEGATIVE mg/dL   Hgb urine dipstick NEGATIVE NEGATIVE   Bilirubin Urine NEGATIVE NEGATIVE   Ketones, ur 20 (A) NEGATIVE mg/dL   Protein, ur NEGATIVE NEGATIVE mg/dL   Nitrite NEGATIVE NEGATIVE   Leukocytes, UA NEGATIVE NEGATIVE   RBC / HPF 0-5 0 - 5 RBC/hpf   WBC, UA 0-5 0 - 5 WBC/hpf   Bacteria, UA NONE SEEN NONE SEEN   Squamous Epithelial / LPF 0-5 (A) NONE SEEN   Mucus PRESENT    ____________________________________________  ____________________________________________  RADIOLOGY  I personally reviewed all radiographic images ordered to evaluate for the above acute complaints and reviewed radiology reports and findings.  These findings were personally discussed with the patient.  Please see medical record for radiology report.  ____________________________________________   PROCEDURES  Procedure(s) performed:  Procedures    Critical Care performed: no ____________________________________________   INITIAL IMPRESSION / ASSESSMENT AND PLAN / ED COURSE  Pertinent labs & imaging results that were available during my care of the patient were reviewed by me and considered in my medical decision making (see chart for details).  DDX: Premature rupture, nonviable pregnancy, UTI, placental abruption, round ligament pain  Rebecca Walters is a 36 y.o. who presents to the ED with symptoms as described above.  Well-appearing and in no acute distress.  Pelvic exam without evidence of premature rupture of membranes.  Ultrasound is reassuring no evidence of abruption and there is evidence of reassuring fetal heart tones.  Her abdominal exam is soft and benign.  This is not clinically consistent with appendicitis.  Denies any symptoms of urinary tract infection.  She has follow-up with her OB/GYN tomorrow.  Will defer further testing to her outpatient appointment as she has no other acute symptoms at this time.      ____________________________________________   FINAL CLINICAL  IMPRESSION(S) / ED DIAGNOSES  Final diagnoses:  Pelvic pain in pregnancy      NEW MEDICATIONS STARTED DURING THIS VISIT:  This SmartLink is deprecated. Use AVSMEDLIST instead to display the medication list for a patient.   Note:  This document was prepared using Dragon voice recognition software and may include unintentional dictation errors.    Willy Eddyobinson, Mizani Dilday, MD 10/27/17 [phone]

## 2017-11-05 ENCOUNTER — Other Ambulatory Visit: Payer: Self-pay | Admitting: Family Medicine

## 2017-11-05 DIAGNOSIS — O09211 Supervision of pregnancy with history of pre-term labor, first trimester: Principal | ICD-10-CM

## 2017-11-05 DIAGNOSIS — O09891 Supervision of other high risk pregnancies, first trimester: Secondary | ICD-10-CM

## 2017-11-12 ENCOUNTER — Ambulatory Visit
Admission: RE | Admit: 2017-11-12 | Discharge: 2017-11-12 | Disposition: A | Discharge status: Home / Self Care | Payer: BLUE CROSS/BLUE SHIELD | Source: Ambulatory Visit | Attending: Family Medicine | Admitting: Family Medicine

## 2017-11-12 DIAGNOSIS — O09891 Supervision of other high risk pregnancies, first trimester: Secondary | ICD-10-CM

## 2017-11-12 DIAGNOSIS — O09211 Supervision of pregnancy with history of pre-term labor, first trimester: Secondary | ICD-10-CM | POA: Diagnosis present

## 2017-11-12 DIAGNOSIS — Z3A00 Weeks of gestation of pregnancy not specified: Secondary | ICD-10-CM | POA: Insufficient documentation

## 2018-01-15 ENCOUNTER — Other Ambulatory Visit: Payer: Self-pay

## 2018-01-15 ENCOUNTER — Observation Stay
Admission: EM | Admit: 2018-01-15 | Discharge: 2018-01-15 | Disposition: A | Discharge status: Home / Self Care | Payer: BLUE CROSS/BLUE SHIELD | Attending: Obstetrics and Gynecology | Admitting: Obstetrics and Gynecology

## 2018-01-15 DIAGNOSIS — R109 Unspecified abdominal pain: Secondary | ICD-10-CM | POA: Diagnosis present

## 2018-01-15 DIAGNOSIS — Z3A30 30 weeks gestation of pregnancy: Secondary | ICD-10-CM | POA: Diagnosis not present

## 2018-01-15 DIAGNOSIS — E890 Postprocedural hypothyroidism: Secondary | ICD-10-CM | POA: Diagnosis not present

## 2018-01-15 DIAGNOSIS — O26893 Other specified pregnancy related conditions, third trimester: Secondary | ICD-10-CM | POA: Diagnosis not present

## 2018-01-15 LAB — URINE DRUG SCREEN, QUALITATIVE (ARMC ONLY)
AMPHETAMINES, UR SCREEN: NOT DETECTED
Barbiturates, Ur Screen: NOT DETECTED
Benzodiazepine, Ur Scrn: NOT DETECTED
Cannabinoid 50 Ng, Ur ~~LOC~~: NOT DETECTED
Cocaine Metabolite,Ur ~~LOC~~: NOT DETECTED
MDMA (ECSTASY) UR SCREEN: NOT DETECTED
METHADONE SCREEN, URINE: NOT DETECTED
Opiate, Ur Screen: NOT DETECTED
Phencyclidine (PCP) Ur S: NOT DETECTED
Tricyclic, Ur Screen: NOT DETECTED

## 2018-01-15 LAB — URINALYSIS, COMPLETE (UACMP) WITH MICROSCOPIC
BILIRUBIN URINE: NEGATIVE
Glucose, UA: 50 mg/dL — AB
HGB URINE DIPSTICK: NEGATIVE
KETONES UR: 5 mg/dL — AB
LEUKOCYTES UA: NEGATIVE
Nitrite: NEGATIVE
PH: 5 (ref 5.0–8.0)
Protein, ur: 30 mg/dL — AB
Specific Gravity, Urine: 1.028 (ref 1.005–1.030)

## 2018-01-15 NOTE — Discharge Summary (Signed)
Obstetric Discharge Summary Reason for Admission: Uterine cramping at 30 3/7 weeks Prenatal Procedures: NST  Intrapartum Procedures: N/A Postpartum Procedures: N/A Complications-Operative and Postpartum: N/A Hemoglobin  Date Value Ref Range Status  09/18/2017 13.1 12.0 - 16.0 g/dL Final   HCT  Date Value Ref Range Status  09/18/2017 38.9 35.0 - 47.0 % Final    Physical Exam:  General: A,A&O x 3 Lochia: mod Uterine Fundus: Gravid DVT Evaluation: Neg Homans  Discharge Diagnoses: Th PTL at 30 3/7 weeks   Discharge Information: Date: 01/15/2018 Activity: Rest at home, increase activity as tolerated Diet:Regular  Medications: PNV Condition: Stable  Instructions: Return to hospital with UC's becoming every 5 mins, ROM, Vag bleeding or decreased fetal movement Discharge to: Home   Newborn Data: This patient has no babies on file.  Sharee Pimplearon W Jones 01/15/2018, 3:26 PM

## 2018-01-15 NOTE — Progress Notes (Addendum)
Patient ID: Rebecca Walters, female   DOB: 05-19-1981, 37 y.o.   MRN: [id number]  Rebecca BroodLatoya Walters is a 37 y.o. female. She is at 9477w3d gestation. Patient's last menstrual period was 06/17/2017 (approximate). Estimated Date of Delivery: 03/23/18  [facility]  Chief complaint:cramping while standing at work today, cramps are in the lower abd. No LOF, NO VB or decreased FM.   Location:lower abd  Onset/timing:q 20 mins Duration: lasting 1 mins Quality: mod  Severity:n/A Aggravating or alleviating conditions:standing at work increased cramps Associated signs/symptoms:no lower back pain, Context:cramping while standing at work  S: Resting comfortably. +cramping lower abd area, no VB.no LOF,  Active fetal movement.  Maternal Medical History:   Past Medical History:  Diagnosis Date  . Thyroid disease     Past Surgical History:  Procedure Laterality Date  . THYROIDECTOMY, PARTIAL      No Known Allergies  Prior to Admission medications   Medication Sig Start Date End Date Taking? Authorizing Provider  doxycycline (VIBRAMYCIN) 50 MG capsule Take 2 capsules (100 mg total) by mouth 2 (two) times daily. 03/11/16   Gayla DossGayle, Eryka A, MD  ibuprofen (ADVIL,MOTRIN) 600 MG tablet Take 1 tablet (600 mg total) by mouth every 6 (six) hours as needed. 10/03/15   Minna AntisPaduchowski, Kevin, MD  ibuprofen (ADVIL,MOTRIN) 600 MG tablet Take 1 tablet (600 mg total) by mouth every 6 (six) hours as needed for moderate pain. 03/11/16   Gayla DossGayle, Eryka A, MD     Social History: She  reports that  has never smoked. she has never used smokeless tobacco. She reports that she drinks alcohol. She reports that she does not use drugs.  Family History:  Mat aunt:breast cancer   Review of Systems: A full review of systems was performed and negative except as noted in the HPI.     O:  BP 117/70 (BP Location: Left Arm)   Pulse 85   Temp 98.2 F (36.8 C) (Oral)   Resp 16   Wt 98 kg (216 lb)   LMP 06/17/2017  (Approximate)   BMI 34.86 kg/m  No results found for this or any previous visit (from the past 48 hour(s)).   Constitutional: NAD, AAOx3  HE/ENT: extraocular movements grossly intact, moist mucous membranes CV: RRR, S1S2, no M/R/G.  PULM: nl respiratory effort, CTABL     Abd: gravid, non-tender, non-distended, soft      Ext: Non-tender, Nonedmeatous   Psych: mood appropriate, speech normal Pelvic:FT ext os/int os is closed/no effacement  NST: Reactive  Baseline: 145 Variability: moderate Accelerations present x >2 Decelerations absent Time 20mins  Toco: no Uterine contractions x > 1 hour seen or palpated while evaluating pt  A/P: 37 y.o. 4377w3d here for antenatal surveillance for lower uterine cramping today  Labor: not present.   Fetal Wellbeing: Reassuring Cat 1 tracing.  Reactive NST   D/c home stable, precautions reviewed, follow-up as scheduled.   ----- Sharee Pimplearon W.Dannon Nguyenthi, RN, MSN, CNM, FNP [facility], Department of OB/GYN [facility]

## 2018-01-15 NOTE — OB Triage Note (Signed)
Pt states she was at work this am around 10 am and she started having cramping that felt like period cramps. She called her doctor and they told her to come to the hospital.

## 2018-01-15 NOTE — Discharge Instructions (Signed)
Preventing Preterm Birth °Preterm birth is when your baby is delivered between 20 weeks and 37 weeks of pregnancy. A full-term pregnancy lasts for at least 37 weeks. Preterm birth can be dangerous for your baby because the last few weeks of pregnancy are an important time for your baby's brain and lungs to grow. Many things can cause a baby to be born early. Sometimes the cause is not known. There are certain factors that make you more likely to experience preterm birth, such as: °· Having a previous baby born preterm. °· Being pregnant with twins or other multiples. °· Having had fertility treatment. °· Being overweight or underweight at the start of your pregnancy. °· Having any of the following during pregnancy: °? An infection, including a urinary tract infection (UTI) or an STI (sexually transmitted infection). °? High blood pressure. °? Diabetes. °? Vaginal bleeding. °· Being age 35 or older. °· Being age 18 or younger. °· Getting pregnant within 6 months of a previous pregnancy. °· Suffering extreme stress or physical or emotional abuse during pregnancy. °· Standing for long periods of time during pregnancy, such as working at a job that requires standing. ° °What are the risks? °The most serious risk of preterm birth is that the baby may not survive. This is more likely to happen if a baby is born before 34 weeks. Other risks and complications of preterm birth may include your baby having: °· Breathing problems. °· Brain damage that affects movement and coordination (cerebral palsy). °· Feeding difficulties. °· Vision or hearing problems. °· Infections or inflammation of the digestive tract (colitis). °· Developmental delays. °· Learning disabilities. °· Higher risk for diabetes, heart disease, and high blood pressure later in life. ° °What can I do to lower my risk? °Medical care ° °The most important thing you can do to lower your risk for preterm birth is to get routine medical care during pregnancy  (prenatal care). If you have a high risk of preterm birth, you may be referred to a health care provider who specializes in managing high-risk pregnancies (perinatologist). You may be given medicine to help prevent preterm birth. °Lifestyle changes °Certain lifestyle changes can also lower your risk of preterm birth: °· Wait at least 6 months after a pregnancy to become pregnant again. °· Try to plan pregnancy for when you are between 19 and 35 years old. °· Get to a healthy weight before getting pregnant. If you are overweight, work with your health care provider to safely lose weight. °· Do not use any products that contain nicotine or tobacco, such as cigarettes and e-cigarettes. If you need help quitting, ask your health care provider. °· Do not drink alcohol. °· Do not use drugs. ° °Where to find support: °For more support, consider: °· Talking with your health care provider. °· Talking with a therapist or substance abuse counselor, if you need help quitting. °· Working with a diet and nutrition specialist (dietitian) or a personal trainer to maintain a healthy weight. °· Joining a support group. ° °Where to find more information: °Learn more about preventing preterm birth from: °· Centers for Disease Control and Prevention: cdc.gov/reproductivehealth/maternalinfanthealth/pretermbirth.htm °· March of Dimes: marchofdimes.org/complications/premature-babies.aspx °· American Pregnancy Association: americanpregnancy.org/labor-and-birth/premature-labor ° °Contact a health care provider if: °· You have any of the following signs of preterm labor before 37 weeks: °? A change or increase in vaginal discharge. °? Fluid leaking from your vagina. °? Pressure or cramps in your lower abdomen. °? A backache that does not   go away or gets worse. ? Regular tightening (contractions) in your lower abdomen. Summary  Preterm birth means having your baby during weeks 20-37 of pregnancy.  Preterm birth may put your baby at risk  for physical and mental problems.  Getting good prenatal care can help prevent preterm birth.  You can lower your risk of preterm birth by making certain lifestyle changes, such as not smoking and not using alcohol. This information is not intended to replace advice given to you by your health care provider. Make sure you discuss any questions you have with your health care provider. Document Released: 12/25/2015 Document Revised: 07/19/2016 Document Reviewed: 07/19/2016 Elsevier Interactive Patient Education  2018 [employer]. Preterm Labor and Birth Information Pregnancy normally lasts 39-41 weeks. Preterm labor is when labor starts early. It starts before you have been pregnant for 37 whole weeks. What are the risk factors for preterm labor? Preterm labor is more likely to occur in women who:  Have an infection while pregnant.  Have a cervix that is short.  Have gone into preterm labor before.  Have had surgery on their cervix.  Are younger than age 34.  Are older than age 57.  Are African American.  Are pregnant with two or more babies.  Take street drugs while pregnant.  Smoke while pregnant.  Do not gain enough weight while pregnant.  Got pregnant right after another pregnancy.  What are the symptoms of preterm labor? Symptoms of preterm labor include:  Cramps. The cramps may feel like the cramps some women get during their period. The cramps may happen with watery poop (diarrhea).  Pain in the belly (abdomen).  Pain in the lower back.  Regular contractions or tightening. It may feel like your belly is getting tighter.  Pressure in the lower belly that seems to get stronger.  More fluid (discharge) leaking from the vagina. The fluid may be watery or bloody.  Water breaking.  Why is it important to notice signs of preterm labor? Babies who are born early may not be fully developed. They have a higher chance for:  Long-term heart problems.  Long-term  lung problems.  Trouble controlling body systems, like breathing.  Bleeding in the brain.  A condition called cerebral palsy.  Learning difficulties.  Death.  These risks are highest for babies who are born before 34 weeks of pregnancy. How is preterm labor treated? Treatment depends on:  How long you were pregnant.  Your condition.  The health of your baby.  Treatment may involve:  Having a stitch (suture) placed in your cervix. When you give birth, your cervix opens so the baby can come out. The stitch keeps the cervix from opening too soon.  Staying at the hospital.  Taking or getting medicines, such as: ? Hormone medicines. ? Medicines to stop contractions. ? Medicines to help the babys lungs develop. ? Medicines to prevent your baby from having cerebral palsy.  What should I do if I am in preterm labor? If you think you are going into labor too soon, call your doctor right away. How can I prevent preterm labor?  Do not use any tobacco products. ? Examples of these are cigarettes, chewing tobacco, and e-cigarettes. ? If you need help quitting, ask your doctor.  Do not use street drugs.  Do not use any medicines unless you ask your doctor if they are safe for you.  Talk with your doctor before taking any herbal supplements.  Make sure you gain enough weight.  Watch for infection. If you think you might have an infection, get it checked right away.  If you have gone into preterm labor before, tell your doctor. This information is not intended to replace advice given to you by your health care provider. Make sure you discuss any questions you have with your health care provider. Document Released: 02/06/2009 Document Revised: 04/22/2016 Document Reviewed: 04/02/2016 Elsevier Interactive Patient Education  2018 [employer]

## 2018-01-16 LAB — URINE CULTURE

## 2018-01-18 ENCOUNTER — Other Ambulatory Visit: Payer: Self-pay | Admitting: Family Medicine

## 2018-01-18 DIAGNOSIS — Z3689 Encounter for other specified antenatal screening: Secondary | ICD-10-CM

## 2018-01-21 ENCOUNTER — Ambulatory Visit
Admission: RE | Admit: 2018-01-21 | Discharge: 2018-01-21 | Disposition: A | Discharge status: Home / Self Care | Payer: BLUE CROSS/BLUE SHIELD | Source: Ambulatory Visit | Attending: Maternal & Fetal Medicine | Admitting: Maternal & Fetal Medicine

## 2018-01-21 ENCOUNTER — Ambulatory Visit
Admission: RE | Admit: 2018-01-21 | Discharge: 2018-01-21 | Disposition: A | Discharge status: Home / Self Care | Payer: BLUE CROSS/BLUE SHIELD | Source: Ambulatory Visit | Attending: Family Medicine | Admitting: Family Medicine

## 2018-01-21 DIAGNOSIS — O283 Abnormal ultrasonic finding on antenatal screening of mother: Secondary | ICD-10-CM | POA: Insufficient documentation

## 2018-01-21 DIAGNOSIS — Z3689 Encounter for other specified antenatal screening: Secondary | ICD-10-CM

## 2018-01-21 DIAGNOSIS — E890 Postprocedural hypothyroidism: Secondary | ICD-10-CM | POA: Diagnosis not present

## 2018-01-21 DIAGNOSIS — O09523 Supervision of elderly multigravida, third trimester: Secondary | ICD-10-CM | POA: Diagnosis not present

## 2018-01-21 DIAGNOSIS — Z8759 Personal history of other complications of pregnancy, childbirth and the puerperium: Secondary | ICD-10-CM | POA: Insufficient documentation

## 2018-01-21 DIAGNOSIS — O99283 Endocrine, nutritional and metabolic diseases complicating pregnancy, third trimester: Secondary | ICD-10-CM | POA: Diagnosis not present

## 2018-01-21 DIAGNOSIS — Z3A31 31 weeks gestation of pregnancy: Secondary | ICD-10-CM | POA: Insufficient documentation

## 2018-01-25 ENCOUNTER — Observation Stay
Admission: EM | Admit: 2018-01-25 | Discharge: 2018-01-25 | Disposition: A | Discharge status: Home / Self Care | Payer: BLUE CROSS/BLUE SHIELD | Attending: Obstetrics and Gynecology | Admitting: Obstetrics and Gynecology

## 2018-01-25 ENCOUNTER — Other Ambulatory Visit: Payer: Self-pay

## 2018-01-25 DIAGNOSIS — E669 Obesity, unspecified: Secondary | ICD-10-CM | POA: Diagnosis not present

## 2018-01-25 DIAGNOSIS — O99283 Endocrine, nutritional and metabolic diseases complicating pregnancy, third trimester: Secondary | ICD-10-CM | POA: Diagnosis not present

## 2018-01-25 DIAGNOSIS — Z3A31 31 weeks gestation of pregnancy: Secondary | ICD-10-CM | POA: Diagnosis not present

## 2018-01-25 DIAGNOSIS — O99213 Obesity complicating pregnancy, third trimester: Secondary | ICD-10-CM | POA: Insufficient documentation

## 2018-01-25 DIAGNOSIS — O09523 Supervision of elderly multigravida, third trimester: Secondary | ICD-10-CM | POA: Insufficient documentation

## 2018-01-25 DIAGNOSIS — O4693 Antepartum hemorrhage, unspecified, third trimester: Secondary | ICD-10-CM | POA: Diagnosis present

## 2018-01-25 DIAGNOSIS — E0590 Thyrotoxicosis, unspecified without thyrotoxic crisis or storm: Secondary | ICD-10-CM | POA: Insufficient documentation

## 2018-01-25 NOTE — Progress Notes (Signed)
Spoke with C.Jones, CNM. Notified of patient admission to the unit and assessment findings.  Patient had some light pink vaginal bleeding directly after intercourse last night but when she got up to the bathroom this morning it was more bright red in color and a larger quantity. OB History is significant for C/S delivery of twins at 34 weeks due to preeclampsia, and two spontaneous vaginal deliveries.

## 2018-01-25 NOTE — OB Triage Note (Signed)
Patient presented to L&D with complaints of vaginal bleeding when she wiped this morning.  States she had intercourse last night. Denies leaking of fluid or decreased fetal movement.

## 2018-01-25 NOTE — Progress Notes (Signed)
Discharged home ambulatory in stable condition. Verbalized understanding of discharge paperwork provided to include pelvic rest instruction and vaginal bleeding in pregnancy as well as reasons to return to the hospital. FHR reassuring. VS stable.

## 2018-01-25 NOTE — Progress Notes (Signed)
Spoke with Beatriz Stallion. Jones, CNM on phone regarding patient to be discharged. Per CNM, pt to be discharged with pelvic rest instructions and vaginal bleeding in pregnancy instructions. CNM reported she recently came to check the patients cervix and reports the cervix is closed. Will discharge patient as ordered.

## 2018-01-25 NOTE — Progress Notes (Signed)
Obstetric Discharge Summary   Patient ID: Rebecca Walters MRN: [id number] DOB/AGE: 38-Oct-1982 38 y.o.   Date of Admission: 01/25/2018  Date of Discharge: 01/25/18  Admitting Diagnosis:IUP at [redacted]w[redacted]d  Secondary Diagnosis: Vaginal bleeding   Mode of Delivery: N/A     Discharge Diagnosis: IUP at 31 6/7 weeks with post-coital bleeding   Intrapartum Procedures: N/A   Post partum procedures: N/A  Complications: Vag bleeding after sex which brought the pt to [facility] triage this am   Brief Hospital Course  Rebecca Walters is a [id number] who had a an LMP of 06/19/18 & EDD of 03/26/18 by dates and US done at 24 6/7 weeks with EDD of 03/23/18 was given as her EDD. When pt went to [facility], they questioned why the LMP was not being used. Pt has a post placenta and no Sign of previa or low lying. Pt had sex at 1 am and at 4am had a 2 cm amt of pinkish bleeding. Pt then got up again and noted a streaking of darker tinged blood on pad and came in. No clots or LOF. No UC's or decreased FM. Pt states she is having lower back pain this am. A cervical exam was closed and non-effaced  Prior to d/c. A small amt of burgundy was noted on examiners hand after exam. No fresh bright red bleeding.   Prenatal course: Pt attends [facility]) and has BC/BS but, cannot afford the $300 deposit nor the $350 monthly payment to come to [facility]. She had a +Quad screen, was seen by [facility] and informed that her risk was 1/218 reduced to 1/156 based on her age of 37. She plans to deliver at [facility]. She also has a hx of AMA, LTCS due to pre-ecclampsia with twins, hyperthyroid with last pregnancy, obesity and is taking a BAby ASA q d.Pt has not been scheduled for a C-section due to her non-compliance to transfer to [facility] for [facility] care. There is no mention in her notes of a TOLAC.   Labs: CBC Latest Ref Rng & Units 09/18/2017 03/11/2016 10/03/2015  WBC 3.6 - 11.0 K/uL 8.5 7.0 7.2  Hemoglobin 12.0 - 16.0 g/dL 14.7 82.9  56.2  Hematocrit 35.0 - 47.0 % 38.9 39.8 40.4  Platelets 150 - 440 K/uL 245 251 237   Physical exam:  Blood pressure 121/79, pulse 79, temperature 98.4 F (36.9 C), temperature source Oral, resp. rate 17, height 5\' 6"  (1.676 m), weight 99.8 kg (220 lb), last menstrual period 06/17/2017. General: alert and no distress General: A,A&O x3 HEENT:normocephalic, Eyes non-icteric. LUNGS: CTA bilat, no W/R/G HEART:S1S2, RRR, NO M/R/G ABD: Gravid EXTREMS: Neg Homans, minimal edema Voiding WNL Taking po WNL Abdomen:Gravid Extremities: No evidence of DVT seen on physical exam. No lower extremity edema.  Discharge Instructions: Pelvic rest till re-evaluated on 02/01/18, no SEX for now, Follow up here for any further vaginal bleeding or concerns such as contractions, bleeding, decreased fetal movement or leak of fluid Activity: Advance as tolerated. BRP. Diet: Regular Medications:PNV, Baby ASA ALLERGIES: NONE KNOWN Outpatient follow up:  [facility] Postpartum contraception: None documented  Discharged Condition: Stable  CX: closed/long Discharged to: [facility]U [facility]18 [facility] and 02/02/18 with [facility] as pt wants a TOLAC  Newborn Data:N/A  Baby: sex of baby not known to pt NST reactive with 2 accels 15 x 15 BPM UC: none on monitor Disposition: Home on pelvic rest  Apgars: APGAR (1 MIN): This patient has no babies on file.  APGAR (5 MINS): This patient has no babies on file.  APGAR (10 MINS): This patient has no babies on file.   Baby Feeding: unknown  Myrtie Cruisearon W. Daeton Kluth,RN, MSN, CNM, FNP Certified Nurse Midwife [facility]

## 2018-06-15 ENCOUNTER — Encounter (HOSPITAL_COMMUNITY): Payer: Self-pay

## 2018-11-27 ENCOUNTER — Emergency Department (HOSPITAL_COMMUNITY)
Admission: EM | Admit: 2018-11-27 | Discharge: 2018-11-27 | Disposition: A | Discharge status: Home / Self Care | Payer: BLUE CROSS/BLUE SHIELD | Attending: Emergency Medicine | Admitting: Emergency Medicine

## 2018-11-27 ENCOUNTER — Encounter (HOSPITAL_COMMUNITY): Payer: Self-pay

## 2018-11-27 DIAGNOSIS — Z79899 Other long term (current) drug therapy: Secondary | ICD-10-CM | POA: Diagnosis not present

## 2018-11-27 DIAGNOSIS — J069 Acute upper respiratory infection, unspecified: Secondary | ICD-10-CM

## 2018-11-27 DIAGNOSIS — R05 Cough: Secondary | ICD-10-CM | POA: Diagnosis present

## 2018-11-27 DIAGNOSIS — E079 Disorder of thyroid, unspecified: Secondary | ICD-10-CM | POA: Diagnosis not present

## 2018-11-27 DIAGNOSIS — B9789 Other viral agents as the cause of diseases classified elsewhere: Secondary | ICD-10-CM

## 2018-11-27 MED ORDER — ACETAMINOPHEN 325 MG PO TABS
650.0000 mg | ORAL_TABLET | Freq: Once | ORAL | Status: AC
  Administered 2018-11-27: 650 mg via ORAL
  Filled 2018-11-27: qty 2

## 2018-11-27 MED ORDER — IBUPROFEN 400 MG PO TABS
600.0000 mg | ORAL_TABLET | Freq: Once | ORAL | Status: AC
  Administered 2018-11-27: 600 mg via ORAL
  Filled 2018-11-27: qty 1

## 2018-11-27 NOTE — ED Provider Notes (Signed)
[facility] EMERGENCY DEPARTMENT Provider Note   CSN: [id number] Arrival date & time: 11/27/18  1438     History   Chief Complaint No chief complaint on file.   HPI Rebecca Walters is a 38 y.o. female.  The history is provided by the patient. No language interpreter was used.   Rebecca Walters is a 38 y.o. female who presents to the Emergency Department complaining of cough. She presents to the emergency department complaining of four days of cough, chest congestion, sore throat. No reports of fever at home but is noted to be febrile on ED arrival. She has mild associated upper abdominal discomfort. No nausea, vomiting, shortness of breath, leg swelling or pain. She has a history of thyroid disease but does not take any medications. No chance that she is currently pregnant. No history of lung disease. She has been treating herself with DayQuil and NyQuil at home. There have been multiple family members ill with similar symptoms. Symptoms are moderate, constant, worsening. Past Medical History:  Diagnosis Date  . Thyroid disease     Patient Active Problem List   Diagnosis Date Noted  . Vaginal bleeding in pregnancy, third trimester 01/25/2018  . Abdominal cramps 01/15/2018    Past Surgical History:  Procedure Laterality Date  . CESAREAN SECTION    . THYROIDECTOMY, PARTIAL       OB History    Gravida  7   Para  3   Term  2   Preterm  0   AB  3   Living  4     SAB  1   TAB  2   Ectopic      Multiple  1   Live Births  4            Home Medications    Prior to Admission medications   Medication Sig Start Date End Date Taking? Authorizing Provider  doxycycline (VIBRAMYCIN) 50 MG capsule Take 2 capsules (100 mg total) by mouth 2 (two) times daily. Patient not taking: Reported on 01/21/2018 03/11/16   Gayla Doss, MD  ibuprofen (ADVIL,MOTRIN) 600 MG tablet Take 1 tablet (600 mg total) by mouth every 6 (six) hours as needed. Patient not  taking: Reported on 01/21/2018 10/03/15   Minna Antis, MD  ibuprofen (ADVIL,MOTRIN) 600 MG tablet Take 1 tablet (600 mg total) by mouth every 6 (six) hours as needed for moderate pain. Patient not taking: Reported on 01/21/2018 03/11/16   Gayla Doss, MD  Prenatal Vit-Fe Fumarate-FA (PRENATAL MULTIVITAMIN) TABS tablet Take 1 tablet by mouth daily at 12 noon.    [provider]  ranitidine (ZANTAC) 75 MG tablet Take 75 mg by mouth at bedtime.    [provider]    Family History No family history on file.  Social History Social History   Tobacco Use  . Smoking status: Never Smoker  . Smokeless tobacco: Never Used  Substance Use Topics  . Alcohol use: No    Frequency: Never  . Drug use: No     Allergies   Patient has no known allergies.   Review of Systems Review of Systems  All other systems reviewed and are negative.    Physical Exam Updated Vital Signs BP (!) 135/99 (BP Location: Right Arm)   Pulse (!) 102   Temp (!) 101.4 F (38.6 C) (Oral)   Resp 16   SpO2 99%   Physical Exam Vitals signs and nursing note reviewed.  Constitutional:  Appearance: She is well-developed.  HENT:     Head: Normocephalic and atraumatic.     Right Ear: Tympanic membrane normal.     Left Ear: Tympanic membrane normal.     Nose: Rhinorrhea present.     Mouth/Throat:     Mouth: Mucous membranes are moist.     Pharynx: No oropharyngeal exudate or posterior oropharyngeal erythema.  Neck:     Musculoskeletal: Neck supple.  Cardiovascular:     Rate and Rhythm: Normal rate and regular rhythm.     Heart sounds: No murmur.  Pulmonary:     Effort: Pulmonary effort is normal. No respiratory distress.     Breath sounds: Normal breath sounds.  Abdominal:     Palpations: Abdomen is soft.     Tenderness: There is no abdominal tenderness. There is no guarding or rebound.  Musculoskeletal:        General: No swelling or tenderness.  Skin:    General: Skin is  warm and dry.     Capillary Refill: Capillary refill takes less than 2 seconds.  Neurological:     Mental Status: She is alert and oriented to person, place, and time.  Psychiatric:        Mood and Affect: Mood normal.        Behavior: Behavior normal.      ED Treatments / Results  Labs (all labs ordered are listed, but only abnormal results are displayed) Labs Reviewed - No data to display  EKG None  Radiology No results found.  Procedures Procedures (including critical care time)  Medications Ordered in ED Medications  ibuprofen (ADVIL,MOTRIN) tablet 600 mg (has no administration in time range)  acetaminophen (TYLENOL) tablet 650 mg (650 mg Oral Given 11/27/18 1706)     Initial Impression / Assessment and Plan / ED Course  I have reviewed the triage vital signs and the nursing notes.  Pertinent labs & imaging results that were available during my care of the patient were reviewed by me and considered in my medical decision making (see chart for details).     Patient here for evaluation of cough, sore throat. She is non-toxic appearing on evaluation with no respiratory distress. No clinical evidence of pneumonia. Counseled patient on home care for viral URI, possible flu. Discussed outpatient follow-up as well as close return precautions.  Final Clinical Impressions(s) / ED Diagnoses   Final diagnoses:  Viral URI with cough    ED Discharge Orders    None       Tilden Fossaees, Eland Lamantia, MD 11/27/18 1727

## 2018-11-27 NOTE — ED Notes (Signed)
Ill for 4 days with temp last nyquil at 0400am today aching all over

## 2018-11-27 NOTE — ED Triage Notes (Signed)
Patient complains of generalized body aches, cough and congestion. Has had symptoms x 4 days. Alert and oriented, NAD

## 2019-02-08 ENCOUNTER — Other Ambulatory Visit: Payer: Self-pay

## 2019-02-08 ENCOUNTER — Emergency Department: Payer: BLUE CROSS/BLUE SHIELD

## 2019-02-08 ENCOUNTER — Encounter: Payer: Self-pay | Admitting: Emergency Medicine

## 2019-02-08 ENCOUNTER — Emergency Department
Admission: EM | Admit: 2019-02-08 | Discharge: 2019-02-08 | Disposition: A | Discharge status: Home / Self Care | Payer: BLUE CROSS/BLUE SHIELD | Attending: Emergency Medicine | Admitting: Emergency Medicine

## 2019-02-08 DIAGNOSIS — R0789 Other chest pain: Secondary | ICD-10-CM | POA: Diagnosis not present

## 2019-02-08 DIAGNOSIS — J069 Acute upper respiratory infection, unspecified: Secondary | ICD-10-CM | POA: Insufficient documentation

## 2019-02-08 DIAGNOSIS — R079 Chest pain, unspecified: Secondary | ICD-10-CM

## 2019-02-08 LAB — BASIC METABOLIC PANEL
Anion gap: 7 (ref 5–15)
BUN: 10 mg/dL (ref 6–20)
CHLORIDE: 106 mmol/L (ref 98–111)
CO2: 26 mmol/L (ref 22–32)
Calcium: 9.1 mg/dL (ref 8.9–10.3)
Creatinine, Ser: 0.66 mg/dL (ref 0.44–1.00)
GFR calc Af Amer: >60 mL/min (ref >60)
GFR calc non Af Amer: >60 mL/min (ref >60)
GLUCOSE: 94 mg/dL (ref 70–99)
POTASSIUM: 3.9 mmol/L (ref 3.5–5.1)
Sodium: 139 mmol/L (ref 135–145)

## 2019-02-08 LAB — CBC
HEMATOCRIT: 42.1 % (ref 36.0–46.0)
HEMOGLOBIN: 13.4 g/dL (ref 12.0–15.0)
MCH: 29.3 pg (ref 26.0–34.0)
MCHC: 31.8 g/dL (ref 30.0–36.0)
MCV: 92.1 fL (ref 80.0–100.0)
Platelets: 263 10*3/uL (ref 150–400)
RBC: 4.57 MIL/uL (ref 3.87–5.11)
RDW: 12.5 % (ref 11.5–15.5)
WBC: 7.2 10*3/uL (ref 4.0–10.5)
nRBC: 0 % (ref 0.0–0.2)

## 2019-02-08 LAB — INFLUENZA PANEL BY PCR (TYPE A & B)
INFLBPCR: NEGATIVE
Influenza A By PCR: NEGATIVE

## 2019-02-08 LAB — TROPONIN I: Troponin I: <0.03 ng/mL (ref <0.03)

## 2019-02-08 IMAGING — CR CHEST - 2 VIEW
2 series · 2 of 2 positions shown · non-contrast
Comparison: [DATE]

CLINICAL DATA: Chest pain

EXAM:
CHEST - 2 VIEW

[chest pa]
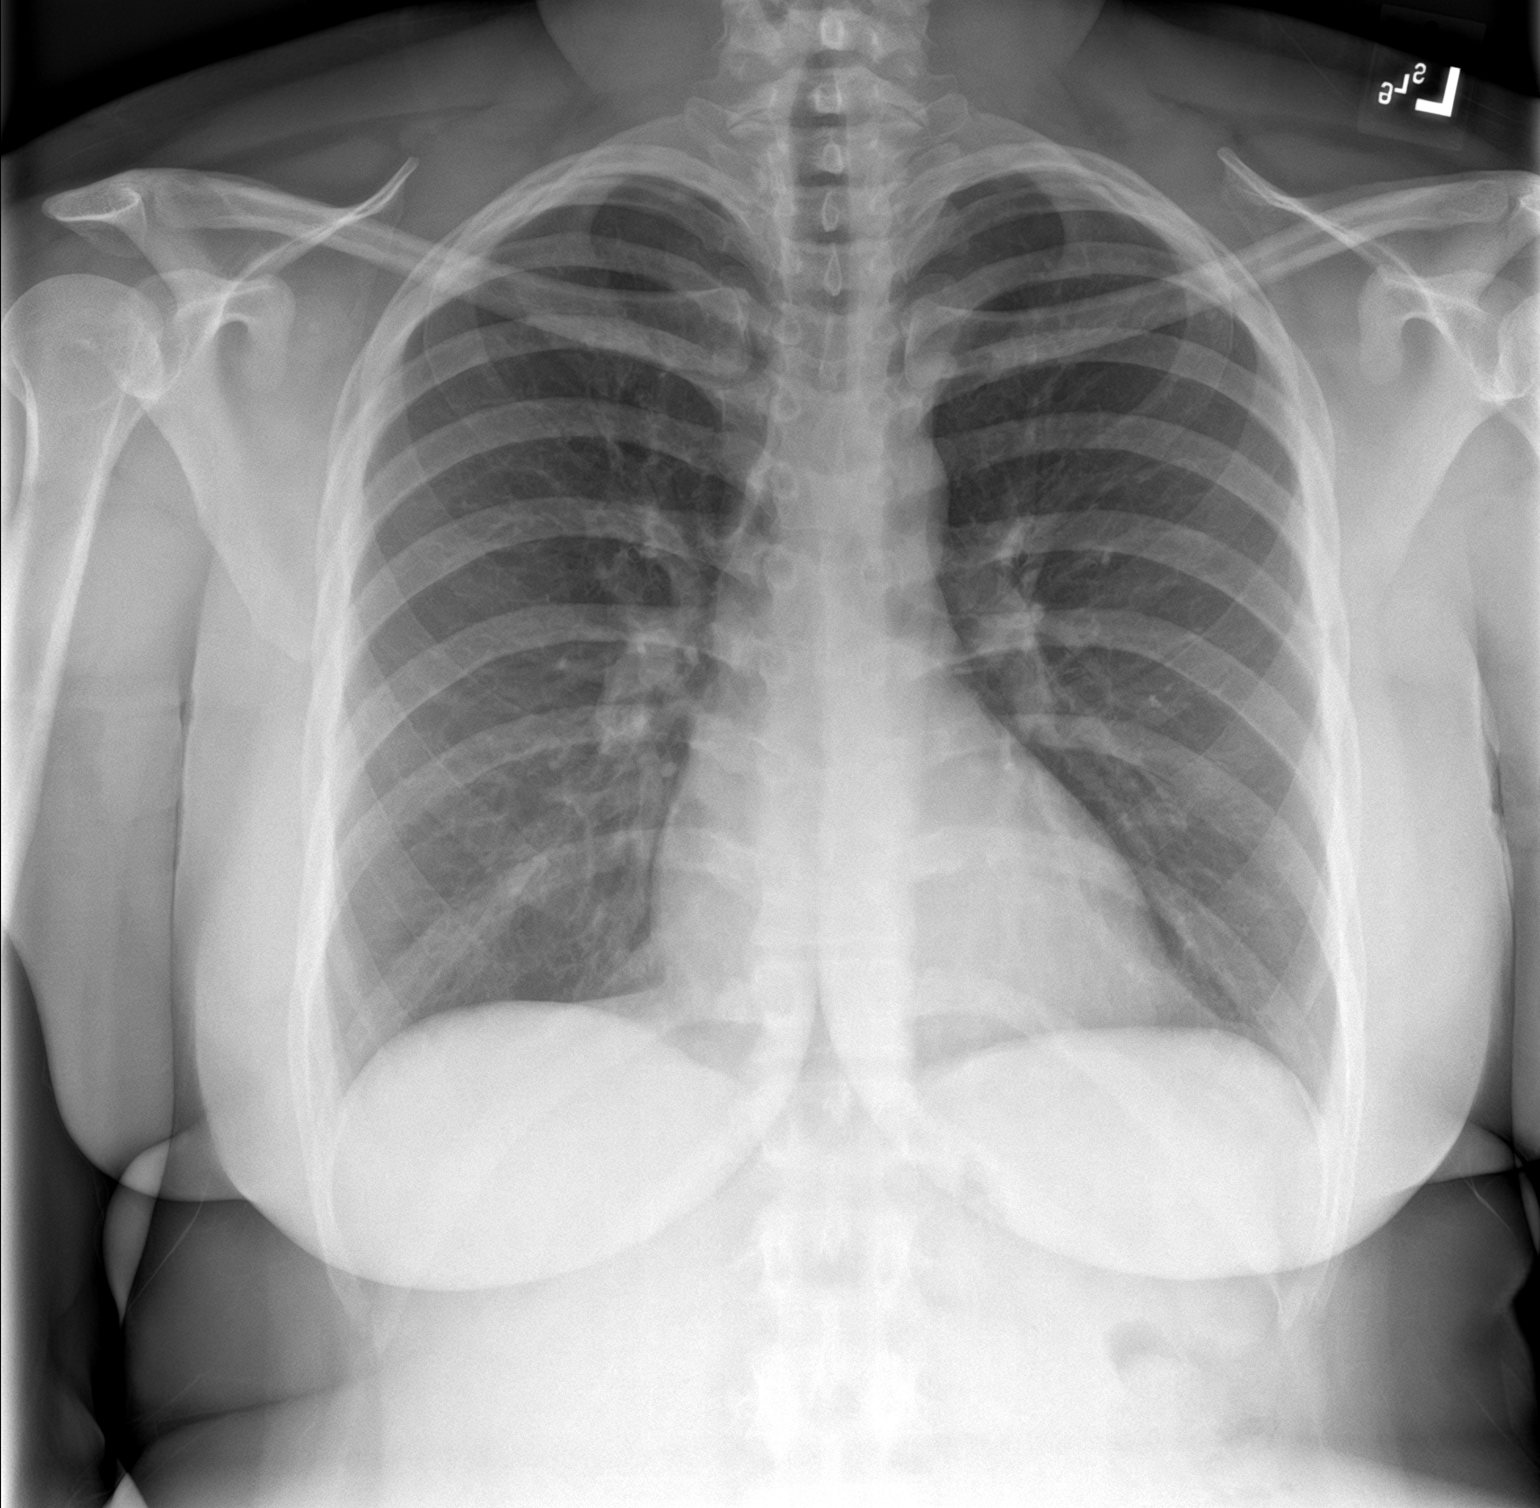

[chest lat]
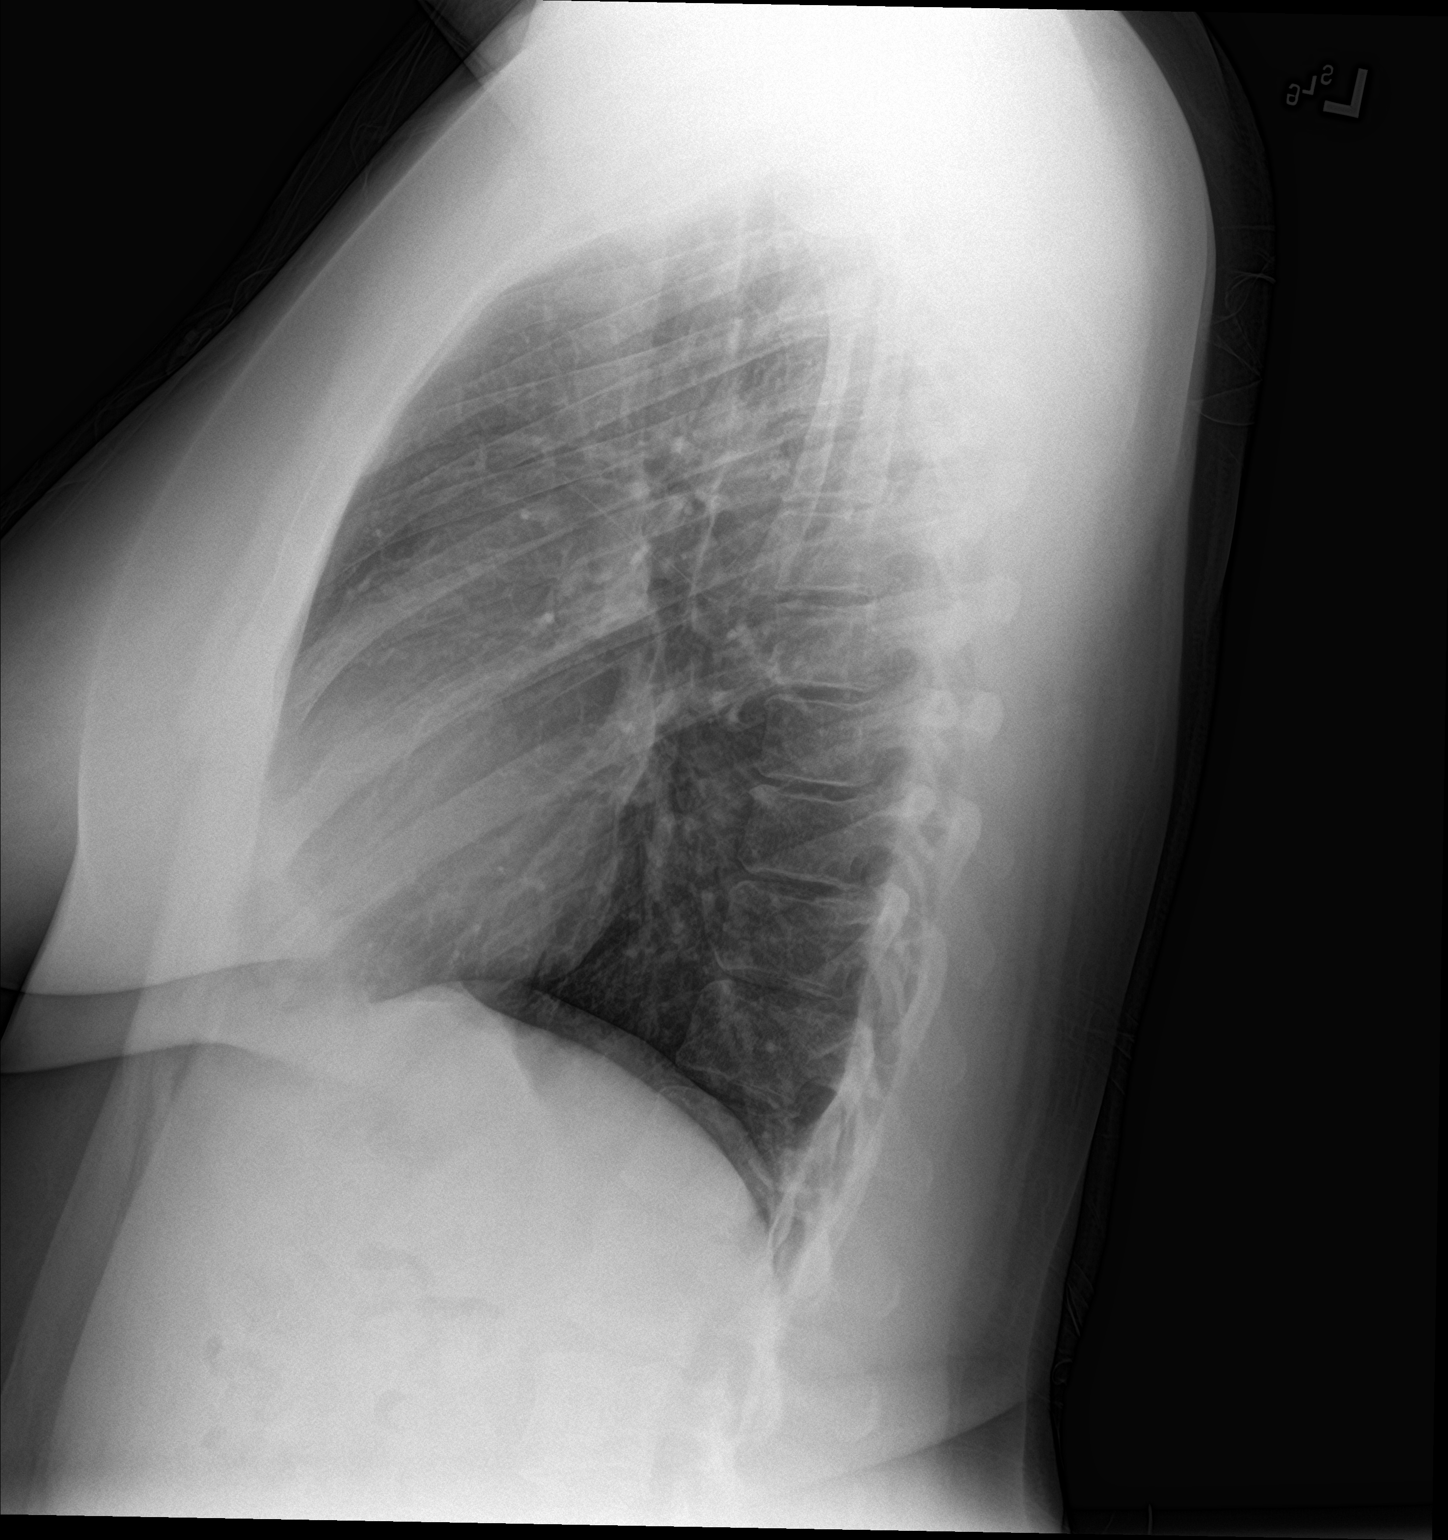

[2 of 2 positions shown; findings below may reference images not displayed]

FINDINGS: The heart size and mediastinal contours are within normal limits.
Both lungs are clear. The visualized skeletal structures are
unremarkable.
IMPRESSION: No active cardiopulmonary disease.

## 2019-02-08 MED ORDER — ACETAMINOPHEN 500 MG PO TABS
1000.0000 mg | ORAL_TABLET | Freq: Once | ORAL | Status: AC
  Administered 2019-02-08: 1000 mg via ORAL
  Filled 2019-02-08: qty 2

## 2019-02-08 NOTE — ED Provider Notes (Signed)
[facility] Emergency Department Provider Note  ____________________________________________  Time seen: Approximately 12:35 PM  I have reviewed the triage vital signs and the nursing notes.   HISTORY  Chief Complaint Chest Pain    HPI Rebecca Walters is a 38 y.o. female, otherwise healthy, presenting with chest pain associate with cough and congestion.  The patient reports that on Friday she had a dull ache in the left side of the chest and this persisted for approximately 3 days.  Yesterday, she sneezed and had a "bubble" sensation from the left lateral chest and back radiating around to the left chest and up towards the clavicle which resolved.  Today, she developed a pressure sensation in the center and left of the chest associated with left upper extremity pain.  She has not had any pleuritic pain, shortness of breath, lightheadedness or syncope, palpitations.  She has had a mild dry cough for several weeks with associated congestion but no fevers or chills, nausea vomiting or diarrhea, or body aches.  She has not had any known sick contacts.  She did have a travel history with travel to [place] by automobile in mid February.  She works at [employer].  She has not traveled outside the [place] or travel to any high risk areas for coronavirus in the [place].  Yesterday, the patient tried Advil with some improvement in her pain.  Past Medical History:  Diagnosis Date  . Thyroid disease     Patient Active Problem List   Diagnosis Date Noted  . Vaginal bleeding in pregnancy, third trimester 01/25/2018  . Abdominal cramps 01/15/2018    Past Surgical History:  Procedure Laterality Date  . CESAREAN SECTION    . THYROIDECTOMY, PARTIAL      Current Outpatient Rx  . Order #: [id number] Class: Historical Med    Allergies Patient has no known allergies.  No family history on file.  Social History Social History   Tobacco Use  . Smoking status:  Never Smoker  . Smokeless tobacco: Never Used  Substance Use Topics  . Alcohol use: Yes    Frequency: Never    Comment: Occassional  . Drug use: No    Review of Systems Constitutional: No fever/chills.  No lightheadedness or syncope.  No diaphoresis. Eyes: No visual changes. ENT: No sore throat.  Positive congestion without rhinorrhea. Cardiovascular: Positive left-sided chest pain. Denies palpitations. Respiratory: Denies shortness of breath.  Positive mild cough. Gastrointestinal: No abdominal pain.  No nausea, no vomiting.  No diarrhea.  No constipation. Genitourinary: Negative for dysuria. Musculoskeletal: Negative for back pain. Skin: Negative for rash. Neurological: Negative for headaches. No focal numbness, tingling or weakness.     ____________________________________________   PHYSICAL EXAM:  VITAL SIGNS: ED Triage Vitals  Enc Vitals Group     BP 02/08/19 1120 (!) 147/99     Pulse Rate 02/08/19 1120 71     Resp 02/08/19 1120 16     Temp 02/08/19 1120 98.4 F (36.9 C)     Temp Source 02/08/19 1120 Oral     SpO2 02/08/19 1120 100 %     Weight 02/08/19 1116 220 lb (99.8 kg)     Height 02/08/19 1116 5\' 6"  (1.676 m)     Head Circumference --      Peak Flow --      Pain Score 02/08/19 1116 6     Pain Loc --      Pain Edu? --      Excl. in  GC? --     Constitutional: Alert and oriented. Answers questions appropriately. Eyes: Conjunctivae are normal.  EOMI. No scleral icterus. Head: Atraumatic. Nose: Positive congestion without rhinorrhea Mouth/Throat: Mucous membranes are moist.   Neck: No stridor.  Supple.  No JVD.  No meningismus. Cardiovascular: Normal rate, regular rhythm. No murmurs, rubs or gallops.  Respiratory: Normal respiratory effort.  No accessory muscle use or retractions. Lungs CTAB.  No wheezes, rales or ronchi. Gastrointestinal: Soft, nontender and nondistended.  No guarding or rebound.  No peritoneal signs. Musculoskeletal: No LE edema. No  ttp in the calves or palpable cords.  Negative Homan's sign. Neurologic:  A&Ox3.  Speech is clear.  Face and smile are symmetric.  EOMI.  Moves all extremities well. Skin:  Skin is warm, dry and intact. No rash noted. Psychiatric: Mood and affect are normal. Speech and behavior are normal.  Normal judgement. ____________________________________________   LABS (all labs ordered are listed, but only abnormal results are displayed)  Labs Reviewed  BASIC METABOLIC PANEL  CBC  TROPONIN I  INFLUENZA PANEL BY PCR (TYPE A & B)  POC URINE PREG, ED   ____________________________________________  EKG  ED ECG REPORT I, Anne-Caroline Sharma Covert, the attending physician, personally viewed and interpreted this ECG.   Date: 02/08/2019  EKG Time: 1117  Rate: 67  Rhythm: normal sinus rhythm  Axis: leftward  Intervals:none  ST&T Change: No STEMI; specific T wave inversion in V1.  ____________________________________________  RADIOLOGY  Dg Chest 2 View  Result Date: 02/08/2019 CLINICAL DATA:  Chest pain EXAM: CHEST - 2 VIEW COMPARISON:  10/03/2015 FINDINGS: The heart size and mediastinal contours are within normal limits. Both lungs are clear. The visualized skeletal structures are unremarkable. IMPRESSION: No active cardiopulmonary disease. Electronically Signed   By: Elige Ko   On: 02/08/2019 12:08    ____________________________________________   PROCEDURES  Procedure(s) performed: None  Procedures  Critical Care performed: No ____________________________________________   INITIAL IMPRESSION / ASSESSMENT AND PLAN / ED COURSE  Pertinent labs & imaging results that were available during my care of the patient were reviewed by me and considered in my medical decision making (see chart for details).  38 y.o. female, otherwise healthy, presenting with chest pain, cough and congestion without fever.  Overall, the patient is well-appearing has normal oxygen saturations.  From a  cardiac standpoint, the patient is low risk for ACS or MI and I do not see any evidence of ischemia on her EKG and her troponin is negative with pain that has been ongoing for 5 days.  No additional work-up is required in the emergency department at this time for this.  The patient does have some mild infectious symptoms, and influenza testing has been ordered.  Her chest x-ray does not show any focal infiltrates or abnormalities.  She likely has a viral URI.  I have talked to the patient and let her know that she does not meet criteria for coronavirus testing at this time.  She did not travel to high risk area and was not in any airports.  However, given that she has ongoing symptoms, I have recommended that she stay out of work until her symptoms have completely resolved for at least 3 days.  She has been given a work note for this.  Anticipate discharge.  ____________________________________________  FINAL CLINICAL IMPRESSION(S) / ED DIAGNOSES  Final diagnoses:  Nonspecific chest pain  Upper respiratory tract infection, unspecified type         NEW MEDICATIONS STARTED  DURING THIS VISIT:  New Prescriptions   No medications on file      Rockne Menghini, MD 02/08/19 1255

## 2019-02-08 NOTE — ED Triage Notes (Signed)
Pt in via POV, reports intermittent left side chest pain since Friday with radiation down left arm, denies associated symptoms.  NAD noted at this time.

## 2019-02-08 NOTE — Discharge Instructions (Addendum)
Please take all proper infection control precautions, including frequent and thorough washing of your hands and coughing into your elbow.  Please do not return to work until you are symptom-free for at least 3 days.  Avoid leaving your home unless absolutely necessary.  Return to the emergency department if you develop severe pain, lightheadedness or fainting, fever, shortness of breath, or any other symptoms concerning to you.

## 2019-02-08 NOTE — ED Notes (Signed)
Pt returned from XRAY 

## 2019-02-08 NOTE — ED Notes (Signed)
Patient transported to X-ray 

## 2019-07-11 ENCOUNTER — Other Ambulatory Visit: Payer: Self-pay

## 2019-07-11 DIAGNOSIS — Z20822 Contact with and (suspected) exposure to covid-19: Secondary | ICD-10-CM

## 2019-07-13 LAB — NOVEL CORONAVIRUS, NAA: SARS-CoV-2, NAA: DETECTED — AB

## 2019-07-22 ENCOUNTER — Other Ambulatory Visit: Payer: Self-pay | Admitting: *Deleted

## 2019-07-22 DIAGNOSIS — Z20822 Contact with and (suspected) exposure to covid-19: Secondary | ICD-10-CM

## 2019-07-24 LAB — NOVEL CORONAVIRUS, NAA: SARS-CoV-2, NAA: NOT DETECTED

## 2020-04-04 ENCOUNTER — Emergency Department (HOSPITAL_COMMUNITY): Payer: BC Managed Care – PPO

## 2020-04-04 ENCOUNTER — Emergency Department (HOSPITAL_COMMUNITY)
Admission: EM | Admit: 2020-04-04 | Discharge: 2020-04-04 | Disposition: A | Discharge status: Home / Self Care | Payer: BC Managed Care – PPO | Attending: Emergency Medicine | Admitting: Emergency Medicine

## 2020-04-04 ENCOUNTER — Other Ambulatory Visit: Payer: Self-pay

## 2020-04-04 DIAGNOSIS — S199XXA Unspecified injury of neck, initial encounter: Secondary | ICD-10-CM | POA: Diagnosis present

## 2020-04-04 DIAGNOSIS — S161XXA Strain of muscle, fascia and tendon at neck level, initial encounter: Secondary | ICD-10-CM

## 2020-04-04 DIAGNOSIS — Y93I9 Activity, other involving external motion: Secondary | ICD-10-CM | POA: Diagnosis not present

## 2020-04-04 DIAGNOSIS — Y999 Unspecified external cause status: Secondary | ICD-10-CM | POA: Diagnosis not present

## 2020-04-04 DIAGNOSIS — Y92410 Unspecified street and highway as the place of occurrence of the external cause: Secondary | ICD-10-CM | POA: Insufficient documentation

## 2020-04-04 MED ORDER — NAPROXEN 500 MG PO TABS: 500.0000 mg | ORAL_TABLET | Freq: Two times a day (BID) | ORAL | 0 refills | Status: AC

## 2020-04-04 MED ORDER — CYCLOBENZAPRINE HCL 5 MG PO TABS: 5.0000 mg | ORAL_TABLET | Freq: Three times a day (TID) | ORAL | 0 refills | Status: AC | PRN

## 2020-04-04 MED ORDER — CYCLOBENZAPRINE HCL 10 MG PO TABS
10.0000 mg | ORAL_TABLET | Freq: Once | ORAL | Status: AC
  Administered 2020-04-04: 10 mg via ORAL
  Filled 2020-04-04: qty 1

## 2020-04-04 MED ORDER — KETOROLAC TROMETHAMINE 60 MG/2ML IM SOLN
60.0000 mg | Freq: Once | INTRAMUSCULAR | Status: AC
  Administered 2020-04-04: 21:00:00 60 mg via INTRAMUSCULAR
  Filled 2020-04-04: qty 2
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
## 2020-05-16 ENCOUNTER — Other Ambulatory Visit: Payer: Self-pay | Admitting: Orthopedic Surgery

## 2020-05-16 DIAGNOSIS — M2391 Unspecified internal derangement of right knee: Secondary | ICD-10-CM

## 2020-05-16 DIAGNOSIS — M5416 Radiculopathy, lumbar region: Secondary | ICD-10-CM

## 2020-06-08 ENCOUNTER — Other Ambulatory Visit: Payer: Self-pay

## 2020-06-08 ENCOUNTER — Ambulatory Visit
Admission: RE | Admit: 2020-06-08 | Discharge: 2020-06-08 | Disposition: A | Discharge status: Home / Self Care | Payer: BC Managed Care – PPO | Source: Ambulatory Visit | Attending: Orthopedic Surgery | Admitting: Orthopedic Surgery

## 2020-06-08 DIAGNOSIS — M2391 Unspecified internal derangement of right knee: Secondary | ICD-10-CM | POA: Insufficient documentation

## 2020-06-08 DIAGNOSIS — M5416 Radiculopathy, lumbar region: Secondary | ICD-10-CM | POA: Diagnosis present

## 2020-10-23 ENCOUNTER — Other Ambulatory Visit: Payer: Self-pay | Admitting: Orthopedic Surgery

## 2020-10-23 DIAGNOSIS — M778 Other enthesopathies, not elsewhere classified: Secondary | ICD-10-CM

## 2020-11-05 ENCOUNTER — Other Ambulatory Visit: Payer: Self-pay

## 2020-11-05 ENCOUNTER — Ambulatory Visit
Admission: RE | Admit: 2020-11-05 | Discharge: 2020-11-05 | Disposition: A | Discharge status: Home / Self Care | Payer: BC Managed Care – PPO | Source: Ambulatory Visit | Attending: Orthopedic Surgery | Admitting: Orthopedic Surgery

## 2020-11-05 DIAGNOSIS — M778 Other enthesopathies, not elsewhere classified: Secondary | ICD-10-CM | POA: Insufficient documentation

## 2023-10-13 ENCOUNTER — Emergency Department (HOSPITAL_COMMUNITY): Payer: BC Managed Care – PPO

## 2023-10-13 ENCOUNTER — Encounter (HOSPITAL_COMMUNITY): Payer: Self-pay | Admitting: Emergency Medicine

## 2023-10-13 ENCOUNTER — Emergency Department (HOSPITAL_COMMUNITY)
Admission: EM | Admit: 2023-10-13 | Discharge: 2023-10-13 | Disposition: A | Discharge status: Home / Self Care | Payer: BC Managed Care – PPO | Attending: Emergency Medicine | Admitting: Emergency Medicine

## 2023-10-13 DIAGNOSIS — R1084 Generalized abdominal pain: Secondary | ICD-10-CM | POA: Diagnosis present

## 2023-10-13 DIAGNOSIS — N3000 Acute cystitis without hematuria: Secondary | ICD-10-CM | POA: Diagnosis not present

## 2023-10-13 LAB — CBC
HCT: 41.4 % (ref 36.0–46.0)
Hemoglobin: 13.2 g/dL (ref 12.0–15.0)
MCH: 30.1 pg (ref 26.0–34.0)
MCHC: 31.9 g/dL (ref 30.0–36.0)
MCV: 94.5 fL (ref 80.0–100.0)
Platelets: 244 10*3/uL (ref 150–400)
RBC: 4.38 MIL/uL (ref 3.87–5.11)
RDW: 12.2 % (ref 11.5–15.5)
WBC: 6.4 10*3/uL (ref 4.0–10.5)
nRBC: 0 % (ref 0.0–0.2)

## 2023-10-13 LAB — HCG, SERUM, QUALITATIVE: Preg, Serum: NEGATIVE

## 2023-10-13 LAB — URINALYSIS, ROUTINE W REFLEX MICROSCOPIC
Bilirubin Urine: NEGATIVE
Glucose, UA: NEGATIVE mg/dL
Hgb urine dipstick: NEGATIVE
Ketones, ur: NEGATIVE mg/dL
Nitrite: NEGATIVE
Protein, ur: NEGATIVE mg/dL
Specific Gravity, Urine: 1.027 (ref 1.005–1.030)
pH: 6 (ref 5.0–8.0)

## 2023-10-13 LAB — COMPREHENSIVE METABOLIC PANEL
ALT: 16 U/L (ref 0–44)
AST: 19 U/L (ref 15–41)
Albumin: 3.5 g/dL (ref 3.5–5.0)
Alkaline Phosphatase: 48 U/L (ref 38–126)
Anion gap: 6 (ref 5–15)
BUN: 9 mg/dL (ref 6–20)
CO2: 25 mmol/L (ref 22–32)
Calcium: 9.3 mg/dL (ref 8.9–10.3)
Chloride: 106 mmol/L (ref 98–111)
Creatinine, Ser: 0.83 mg/dL (ref 0.44–1.00)
GFR, Estimated: >60 mL/min (ref >60)
Glucose, Bld: 99 mg/dL (ref 70–99)
Potassium: 4 mmol/L (ref 3.5–5.1)
Sodium: 137 mmol/L (ref 135–145)
Total Bilirubin: 0.7 mg/dL (ref <1.2)
Total Protein: 6.2 g/dL — ABNORMAL LOW (ref 6.5–8.1)

## 2023-10-13 LAB — LIPASE, BLOOD: Lipase: 20 U/L (ref 11–51)

## 2023-10-13 MED ORDER — IOHEXOL 350 MG/ML SOLN
75.0000 mL | Freq: Once | INTRAVENOUS | Status: AC | PRN
  Administered 2023-10-13: 75 mL via INTRAVENOUS

## 2023-10-13 MED ORDER — CEPHALEXIN 500 MG PO CAPS: 500.0000 mg | ORAL_CAPSULE | Freq: Four times a day (QID) | ORAL | 0 refills | Status: DC

## 2023-10-13 MED ORDER — CEPHALEXIN 250 MG PO CAPS
500.0000 mg | ORAL_CAPSULE | Freq: Once | ORAL | Status: AC
  Administered 2023-10-13: 500 mg via ORAL
  Filled 2023-10-13: qty 2

## 2023-10-13 MED ORDER — OXYCODONE-ACETAMINOPHEN 5-325 MG PO TABS
1.0000 | ORAL_TABLET | Freq: Once | ORAL | Status: AC
  Administered 2023-10-13: 1 via ORAL
  Filled 2023-10-13: qty 1

## 2023-10-13 MED ORDER — CEPHALEXIN 500 MG PO CAPS: 500.0000 mg | ORAL_CAPSULE | Freq: Four times a day (QID) | ORAL | 0 refills | Status: AC

## 2023-10-13 NOTE — ED Notes (Signed)
Pt awaiting CT results. Sts resolution of abdominal pain. Denies any additional needs. VS updated

## 2023-10-13 NOTE — ED Provider Notes (Signed)
[place] EMERGENCY DEPARTMENT AT [facility] Provider Note   CSN: [id number] Arrival date & time: 10/13/23  1104     History  Chief Complaint  Patient presents with   Abdominal Pain    Rebecca Walters is a 42 y.o. female with a history of thyroid disease who presents the ED today for abdominal pain.  Patient reports diffuse abdominal pain radiating to her bilateral flanks for the past 2 weeks.  She denies associated nausea, vomiting, diarrhea, or fevers.  No pain or difficulty with urination.  She has tried over-the-counter analgesics without any relief of symptoms.  No dysuria or hematuria.  Denies weakness, dizziness, or loss of sensation at this time.    Home Medications Prior to Admission medications   Medication Sig Start Date End Date Taking? Authorizing Provider  cephALEXin (KEFLEX) 500 MG capsule Take 1 capsule (500 mg total) by mouth 4 (four) times daily for 5 days. 10/13/23 10/18/23  Maxwell Marion, PA-C  naproxen (NAPROSYN) 500 MG tablet Take 1 tablet (500 mg total) by mouth 2 (two) times daily. 04/04/20   Arlyn Dunning, PA-C      Allergies    Patient has no known allergies.    Review of Systems   Review of Systems  Gastrointestinal:  Positive for abdominal pain.  All other systems reviewed and are negative.   Physical Exam Updated Vital Signs BP (!) 143/89 (BP Location: Left Wrist)   Pulse 66   Temp 98.3 F (36.8 C) (Oral)   Resp 15   SpO2 100%  Physical Exam Vitals and nursing note reviewed.  Constitutional:      Appearance: Normal appearance.  HENT:     Head: Normocephalic and atraumatic.     Mouth/Throat:     Mouth: Mucous membranes are moist.  Eyes:     Conjunctiva/sclera: Conjunctivae normal.     Pupils: Pupils are equal, round, and reactive to light.  Cardiovascular:     Rate and Rhythm: Normal rate and regular rhythm.     Pulses: Normal pulses.  Pulmonary:     Effort: Pulmonary effort is normal.     Breath sounds: Normal breath  sounds.  Abdominal:     Palpations: Abdomen is soft.     Tenderness: There is no abdominal tenderness. There is no right CVA tenderness or left CVA tenderness.     Comments: No tenderness appreciated on exam  Skin:    General: Skin is warm and dry.     Findings: No rash.  Neurological:     General: No focal deficit present.     Mental Status: She is alert.     Sensory: No sensory deficit.     Motor: No weakness.  Psychiatric:        Mood and Affect: Mood normal.        Behavior: Behavior normal.    ED Results / Procedures / Treatments   Labs (all labs ordered are listed, but only abnormal results are displayed) Labs Reviewed  COMPREHENSIVE METABOLIC PANEL - Abnormal; Notable for the following components:      Result Value   Total Protein 6.2 (*)    All other components within normal limits  URINALYSIS, ROUTINE W REFLEX MICROSCOPIC - Abnormal; Notable for the following components:   APPearance HAZY (*)    Leukocytes,Ua LARGE (*)    Bacteria, UA RARE (*)    All other components within normal limits  URINE CULTURE  LIPASE, BLOOD  CBC  HCG, SERUM, QUALITATIVE  EKG None  Radiology CT ABDOMEN PELVIS W CONTRAST  Result Date: 10/13/2023 CLINICAL DATA:  Abdominal pain, acute, nonlocalized EXAM: CT ABDOMEN AND PELVIS WITH CONTRAST TECHNIQUE: Multidetector CT imaging of the abdomen and pelvis was performed using the standard protocol following bolus administration of intravenous contrast. RADIATION DOSE REDUCTION: This exam was performed according to the departmental dose-optimization program which includes automated exposure control, adjustment of the mA and/or kV according to patient size and/or use of iterative reconstruction technique. CONTRAST:  75mL OMNIPAQUE IOHEXOL 350 MG/ML SOLN COMPARISON:  None Available. FINDINGS: Lower chest: No acute abnormality Hepatobiliary: No focal hepatic abnormality. Gallbladder unremarkable. Pancreas: No focal abnormality or ductal dilatation.  Spleen: No focal abnormality.  Normal size. Adrenals/Urinary Tract: No adrenal abnormality. No focal renal abnormality. No stones or hydronephrosis. Urinary bladder is unremarkable. Stomach/Bowel: Right colonic diverticulosis. No active diverticulitis. Stomach and small bowel decompressed. Normal appendix. Vascular/Lymphatic: No evidence of aneurysm or adenopathy. Few scattered calcifications in the lower aorta and common iliac arteries. Reproductive: Uterus and adnexa unremarkable.  No mass. Other: No free fluid or free air. Musculoskeletal: No acute bony abnormality. IMPRESSION: No acute findings in the abdomen or pelvis. Right colonic diverticulosis. Scattered aortoiliac atherosclerosis. Electronically Signed   By: Charlett Nose M.D.   On: 10/13/2023 22:48    Procedures Procedures: not indicated.    Medications Ordered in ED Medications  oxyCODONE-acetaminophen (PERCOCET/ROXICET) 5-325 MG per tablet 1 tablet (1 tablet Oral Given 10/13/23 1712)  iohexol (OMNIPAQUE) 350 MG/ML injection 75 mL (75 mLs Intravenous Contrast Given 10/13/23 1928)  cephALEXin (KEFLEX) capsule 500 mg (500 mg Oral Given 10/13/23 2311)    ED Course/ Medical Decision Making/ A&P                                 Medical Decision Making Amount and/or Complexity of Data Reviewed Labs: ordered.  Risk Prescription drug management.   This patient presents to the ED for concern of abdominal pain, this involves an extensive number of treatment options, and is a complaint that carries with it a high risk of complications and morbidity.   Differential diagnosis includes: gastroenteritis   Comorbidities  See HPI above   Additional History  Additional history obtained from prior records.   Lab Tests  I ordered and personally interpreted labs.  The pertinent results include:   UA shows large leukocytes and rare bacteria. Culture pending. CMP, lipase, and CBC are within normal limits no acute infection, anemia,  electrolyte abnormality, or AKI Negative pregnancy test   Imaging Studies  I ordered imaging studies including CT abdomen/pelvis  I independently visualized and interpreted imaging which showed: No acute findings in the abdomen or pelvis. I agree with the radiologist interpretation   Problem List / ED Course / Critical Interventions / Medication Management  Abdominal pain I ordered medications including: Percocet for pain First dose Keflex given in the ED tonight  Reevaluation of the patient after these medicines showed that the patient improved I have reviewed the patients home medicines and have made adjustments as needed   Social Determinants of Health  Access to healthcare   Test / Admission - Considered  Discussed findings with patient.  All questions were answered. She is hemodynamically stable and safe for discharge home. Return precautions given.       Final Clinical Impression(s) / ED Diagnoses Final diagnoses:  Acute cystitis without hematuria  Generalized abdominal pain    Rx /  DC Orders ED Discharge Orders          Ordered    cephALEXin (KEFLEX) 500 MG capsule  4 times daily,   Status:  Discontinued        10/13/23 2257    cephALEXin (KEFLEX) 500 MG capsule  4 times daily        10/13/23 2314              Maxwell Marion, PA-C 10/13/23 2317    Franne Forts, DO 10/24/23 1046

## 2023-10-13 NOTE — ED Notes (Signed)
Patient transported to CT 

## 2023-10-13 NOTE — ED Triage Notes (Signed)
Pt to ED POV from home. Pt c/o diffuse abdominal pain radiating to bilateral back / flank x2 weeks. Pt also reports intermittent numbness / tingling in hands x2 weeks. Pt denies N/V/D. Pt denies any urinary s/s.

## 2023-10-13 NOTE — Discharge Instructions (Addendum)
As discussed, your labs and imaging are reassuring. You are positive for UTI.  We will start you on Keflex.  Take this 4 times a day for the next 5 days.  Your urine has been sent to culture.  You will receive a call from [place] in the next several days if you need to be switched on antibiotics.  If you do not hear from [place] keep taking Keflex.  Follow-up with your primary care in 1 week for reevaluation.  Get help right away if: You have very bad back pain. You have very bad pain in your lower belly. You have a fever. You have chills. You feeling like you will vomit or you vomit.

## 2023-10-13 NOTE — ED Provider Triage Note (Signed)
Emergency Medicine Provider Triage Evaluation Note  Rebecca Walters , a 42 y.o. female  was evaluated in triage.  Pt complains of diffuse abdominal pain radiating to flanks, back for 2 weeks.  She reports some tingling in her hands intermittently, no tingling at time my evaluation.  She denies any nausea, vomiting, diarrhea.  Denies any dysuria, hematuria.  She reports that she thought it was related to her menstrual cycle but that pain did not change after completing her menstrual cycle last week, she denies any ongoing vaginal bleeding.  She denies any vaginal discharge.  She has not take anything for pain, she reports no antiemetics at a low bit better is laying on her back..  Review of Systems  Positive: Abdominal pain Negative: Nausea, vomiting, dysuria, fever, chills  Physical Exam  BP (!) 142/98 (BP Location: Right Arm)   Pulse 74   Temp 98.5 F (36.9 C)   Resp 16   SpO2 96%  Gen:   Awake, no distress   Resp:  Normal effort  MSK:   Moves extremities without difficulty  Other:  Patient reports pain not replicated when pressing on the abdomen  Medical Decision Making  Medically screening exam initiated at 12:43 PM.  Appropriate orders placed.  Rebecca Walters was informed that the remainder of the evaluation will be completed by another provider, this initial triage assessment does not replace that evaluation, and the importance of remaining in the ED until their evaluation is complete.  Workup initiated in triage    Rebecca Floss, PA-C 10/13/23 1244

## 2023-10-15 LAB — URINE CULTURE: Culture: <10000 — AB

## 2024-07-28 ENCOUNTER — Other Ambulatory Visit: Payer: Self-pay

## 2024-07-28 ENCOUNTER — Emergency Department
Admission: EM | Admit: 2024-07-28 | Discharge: 2024-07-28 | Disposition: A | Discharge status: Home / Self Care | Attending: Emergency Medicine | Admitting: Emergency Medicine

## 2024-07-28 ENCOUNTER — Emergency Department

## 2024-07-28 DIAGNOSIS — R0782 Intercostal pain: Secondary | ICD-10-CM | POA: Diagnosis present

## 2024-07-28 DIAGNOSIS — N12 Tubulo-interstitial nephritis, not specified as acute or chronic: Secondary | ICD-10-CM | POA: Diagnosis not present

## 2024-07-28 LAB — BASIC METABOLIC PANEL WITH GFR
Anion gap: 10 (ref 5–15)
BUN: 12 mg/dL (ref 6–20)
CO2: 26 mmol/L (ref 22–32)
Calcium: 9.6 mg/dL (ref 8.9–10.3)
Chloride: 103 mmol/L (ref 98–111)
Creatinine, Ser: 0.81 mg/dL (ref 0.44–1.00)
GFR, Estimated: >60 mL/min (ref >60)
Glucose, Bld: 144 mg/dL — ABNORMAL HIGH (ref 70–99)
Potassium: 4 mmol/L (ref 3.5–5.1)
Sodium: 139 mmol/L (ref 135–145)

## 2024-07-28 LAB — CBC
HCT: 44.2 % (ref 36.0–46.0)
Hemoglobin: 14.5 g/dL (ref 12.0–15.0)
MCH: 30.3 pg (ref 26.0–34.0)
MCHC: 32.8 g/dL (ref 30.0–36.0)
MCV: 92.3 fL (ref 80.0–100.0)
Platelets: 254 K/uL (ref 150–400)
RBC: 4.79 MIL/uL (ref 3.87–5.11)
RDW: 12.6 % (ref 11.5–15.5)
WBC: 7.6 K/uL (ref 4.0–10.5)
nRBC: 0 % (ref 0.0–0.2)

## 2024-07-28 LAB — URINALYSIS, ROUTINE W REFLEX MICROSCOPIC
Bilirubin Urine: NEGATIVE
Glucose, UA: NEGATIVE mg/dL
Hgb urine dipstick: NEGATIVE
Ketones, ur: NEGATIVE mg/dL
Nitrite: NEGATIVE
Protein, ur: 30 mg/dL — AB
Specific Gravity, Urine: 1.033 — ABNORMAL HIGH (ref 1.005–1.030)
pH: 5 (ref 5.0–8.0)

## 2024-07-28 LAB — TROPONIN I (HIGH SENSITIVITY): Troponin I (High Sensitivity): 3 ng/L (ref <18)

## 2024-07-28 LAB — RESP PANEL BY RT-PCR (RSV, FLU A&B, COVID)  RVPGX2
Influenza A by PCR: NEGATIVE
Influenza B by PCR: NEGATIVE
Resp Syncytial Virus by PCR: NEGATIVE
SARS Coronavirus 2 by RT PCR: NEGATIVE

## 2024-07-28 LAB — POC URINE PREG, ED: Preg Test, Ur: NEGATIVE

## 2024-07-28 MED ORDER — CEFPODOXIME PROXETIL 200 MG PO TABS: 200.0000 mg | ORAL_TABLET | Freq: Two times a day (BID) | ORAL | 0 refills | Status: AC

## 2024-07-28 MED ORDER — HYDROCODONE-ACETAMINOPHEN 5-325 MG PO TABS: 1.0000 | ORAL_TABLET | Freq: Four times a day (QID) | ORAL | 0 refills | Status: AC | PRN

## 2024-07-28 MED ORDER — ACETAMINOPHEN 325 MG PO TABS
650.0000 mg | ORAL_TABLET | Freq: Once | ORAL | Status: AC
  Administered 2024-07-28: 650 mg via ORAL
  Filled 2024-07-28: qty 2

## 2024-07-28 MED ORDER — IBUPROFEN 600 MG PO TABS
600.0000 mg | ORAL_TABLET | Freq: Once | ORAL | Status: AC
  Administered 2024-07-28: 600 mg via ORAL
  Filled 2024-07-28: qty 1

## 2024-07-28 NOTE — Discharge Instructions (Addendum)
 Your urinalysis showed signs of infection. Since you are reporting pain in your back I am concerned this has spread to your kidneys. Please take the antibiotics as prescribed.   You tested negative for flu, covid and rsv today. Your chest xray was normal. Your blood work was normal. Your EKG was normal.  Please monitor your symptoms over the next couple of days and if you have any worsening symptoms return to the ED.

## 2024-07-28 NOTE — ED Provider Notes (Signed)
 [facility] Provider Note    Event Date/Time   First MD Initiated Contact with Patient 07/28/24 1151     (approximate)   History   Flank Pain   HPI  Rebecca Walters is a 43 y.o. female with PMH of thyroid disease presents for evaluation of what she describes as rib pain.  Patient states she has pain at the bottom of her ribs that starts at her sides and wraps around to the front and back.  She also endorses some shortness of breath that would be worse with exertion.  No pain with deep breaths.  She describes the pain as an aching feeling.  She denies respiratory symptoms but states she felt similarly when she had COVID.  Denies abdominal pain, urinary symptoms, vaginal bleeding, vaginal discharge.  No fevers at home.  She tried taking ibuprofen  this morning and that did not improve her pain so she came to the ER.      Physical Exam   Triage Vital Signs: ED Triage Vitals  Encounter Vitals Group     BP 07/28/24 1106 (!) 135/96     Girls Systolic BP Percentile --      Girls Diastolic BP Percentile --      Boys Systolic BP Percentile --      Boys Diastolic BP Percentile --      Pulse Rate 07/28/24 1106 75     Resp 07/28/24 1106 18     Temp 07/28/24 1106 98 F (36.7 C)     Temp src --      SpO2 07/28/24 1106 100 %     Weight 07/28/24 1057 221 lb (100.2 kg)     Height 07/28/24 1057 5' 6 (1.676 m)     Head Circumference --      Peak Flow --      Pain Score 07/28/24 1057 8     Pain Loc --      Pain Education --      Exclude from Growth Chart --     Most recent vital signs: Vitals:   07/28/24 1106  BP: (!) 135/96  Pulse: 75  Resp: 18  Temp: 98 F (36.7 C)  SpO2: 100%   General: Awake, no distress.  CV:  Good peripheral perfusion.  RRR. Resp:  Normal effort.  CTAB. Abd:  No distention.  Soft, nontender to palpation, no CVA tenderness. Other:     ED Results / Procedures / Treatments   Labs (all labs ordered are listed, but only abnormal  results are displayed) Labs Reviewed  URINALYSIS, ROUTINE W REFLEX MICROSCOPIC - Abnormal; Notable for the following components:      Result Value   Color, Urine AMBER (*)    APPearance HAZY (*)    Specific Gravity, Urine 1.033 (*)    Protein, ur 30 (*)    Leukocytes,Ua LARGE (*)    Bacteria, UA RARE (*)    All other components within normal limits  BASIC METABOLIC PANEL WITH GFR - Abnormal; Notable for the following components:   Glucose, Bld 144 (*)    All other components within normal limits  RESP PANEL BY RT-PCR (RSV, FLU A&B, COVID)  [id number]  CBC  POC URINE PREG, ED  TROPONIN I (HIGH SENSITIVITY)  TROPONIN I (HIGH SENSITIVITY)     EKG  ED provider interpretation: NSR  Vent. rate 64 BPM PR interval 196 ms QRS duration 92 ms QT/QTcB 378/389 ms P-R-T axes 56 -28 52   RADIOLOGY  Chest x-ray obtained, I interpreted the images as well as reviewed the radiologist report, which was negative for any acute cardiopulmonary abnormalities.   PROCEDURES:  Critical Care performed: No  Procedures   MEDICATIONS ORDERED IN ED: Medications  acetaminophen  (TYLENOL ) tablet 650 mg (650 mg Oral Given 07/28/24 1242)  ibuprofen  (ADVIL ) tablet 600 mg (600 mg Oral Given 07/28/24 1243)     IMPRESSION / MDM / ASSESSMENT AND PLAN / ED COURSE  I reviewed the triage vital signs and the nursing notes.                             43 year old female presents for evaluation of rib pain.  Blood pressure is a little bit elevated otherwise vital signs are stable.  Patient NAD on exam.  Differential diagnosis includes, but is not limited to, muscle strain, viral infection, UTI, pyelonephritis, nephrolithiasis. Less likely PE, ACS, pneumonia, pneumothorax.  Patient's presentation is most consistent with acute complicated illness / injury requiring diagnostic workup.  BMP is unremarkable aside from elevated glucose.  CBC is within normal limits.  Pregnancy test is negative.  Urinalysis shows  presence of protein, large leukocytes with 21-50 WBCs and rare bacteria.  Given the location of patient's pain will rule out ACS with troponin and EKG.  Based on PERC rule cannot exclude PE as a cause of her shortness of breath.  Will add a chest x-ray to look for pneumonia and pneumothorax.  Did consider CT imaging but did not feel this would provide new information. There is no hgb in the urine so do not suspect a kidney stone. Will treat patient's UTI with pyelonephritis given her reports of pain to her back.  Labs otherwise normal so do not suspect acute abdominal pathology.  Patient was given both Tylenol  and ibuprofen  for pain while in the emergency department and she has not gotten much relief from this so I will send a couple days of pain medication in addition to her oral antibiotics.  Reviewed return precautions, patient voiced understanding, all questions were answered she was stable at discharge.  Clinical Course as of 07/28/24 1453  Thu Jul 28, 2024  1420 Troponin I (High Sensitivity) Not elevated. [LD]  1435 Resp panel by RT-PCR (RSV, Flu A&B, Covid) Anterior Nasal Swab Resp panel is negative. [LD]  1436 DG Chest 2 View Negative. [LD]  1453 EKG shows normal sinus rhythm, low suspicion for ACS as cause of her pain. [LD]    Clinical Course User Index [LD] Cleaster Tinnie LABOR, PA-C     FINAL CLINICAL IMPRESSION(S) / ED DIAGNOSES   Final diagnoses:  Pyelonephritis     Rx / DC Orders   ED Discharge Orders          Ordered    cefpodoxime  (VANTIN ) 200 MG tablet  2 times daily        07/28/24 1444    HYDROcodone -acetaminophen  (NORCO/VICODIN) 5-325 MG tablet  Every 6 hours PRN        07/28/24 1452             Note:  This document was prepared using Dragon voice recognition software and may include unintentional dictation errors.   Cleaster Tinnie LABOR, PA-C 07/28/24 1455    Waymond Lorelle Cummins, MD 07/28/24 [phone]

## 2024-07-28 NOTE — ED Triage Notes (Signed)
 Pt comes with left flank pain that radiates around back. Pt states now on right now. Pt states rib pain. Pt denies any injuries.  Pt denies any urinary symptoms.
